# Patient Record
Sex: Female | Born: 1955 | Race: White | Hispanic: No | Marital: Married | Smoking: Never smoker
Health system: Southern US, Community
[De-identification: ages and names within clinical notes are randomized; demographics above are authoritative.]

## PROBLEM LIST (undated history)

## (undated) DIAGNOSIS — I10 Essential (primary) hypertension: Secondary | ICD-10-CM

## (undated) DIAGNOSIS — K769 Liver disease, unspecified: Secondary | ICD-10-CM

## (undated) DIAGNOSIS — N289 Disorder of kidney and ureter, unspecified: Secondary | ICD-10-CM

## (undated) DIAGNOSIS — M797 Fibromyalgia: Secondary | ICD-10-CM

## (undated) DIAGNOSIS — R011 Cardiac murmur, unspecified: Secondary | ICD-10-CM

## (undated) DIAGNOSIS — I4510 Unspecified right bundle-branch block: Secondary | ICD-10-CM

## (undated) DIAGNOSIS — M1990 Unspecified osteoarthritis, unspecified site: Secondary | ICD-10-CM

## (undated) HISTORY — PX: PARTIAL HYSTERECTOMY: SHX80

## (undated) HISTORY — PX: KNEE SURGERY: SHX244

---

## 2009-06-08 ENCOUNTER — Emergency Department (HOSPITAL_COMMUNITY): Admission: EM | Admit: 2009-06-08 | Discharge: 2009-06-08 | Payer: Self-pay | Admitting: Emergency Medicine

## 2009-09-14 ENCOUNTER — Encounter: Admission: RE | Admit: 2009-09-14 | Discharge: 2009-09-14 | Payer: Self-pay | Admitting: Gastroenterology

## 2009-09-27 ENCOUNTER — Ambulatory Visit (HOSPITAL_COMMUNITY): Admission: RE | Admit: 2009-09-27 | Discharge: 2009-09-27 | Payer: Self-pay | Admitting: Gastroenterology

## 2010-03-26 LAB — URINALYSIS, ROUTINE W REFLEX MICROSCOPIC
Ketones, ur: NEGATIVE mg/dL
Protein, ur: NEGATIVE mg/dL
Urobilinogen, UA: 1 mg/dL (ref 0.0–1.0)
pH: 6 (ref 5.0–8.0)

## 2010-03-26 LAB — DIFFERENTIAL
Basophils Absolute: 0.1 10*3/uL (ref 0.0–0.1)
Basophils Relative: 1 % (ref 0–1)
Eosinophils Absolute: 0.2 10*3/uL (ref 0.0–0.7)
Eosinophils Relative: 1 % (ref 0–5)
Lymphocytes Relative: 13 % (ref 12–46)
Lymphs Abs: 1.7 10*3/uL (ref 0.7–4.0)
Monocytes Absolute: 1.1 10*3/uL — ABNORMAL HIGH (ref 0.1–1.0)
Neutro Abs: 9.7 10*3/uL — ABNORMAL HIGH (ref 1.7–7.7)

## 2010-03-26 LAB — COMPREHENSIVE METABOLIC PANEL
BUN: 9 mg/dL (ref 6–23)
Chloride: 103 mEq/L (ref 96–112)
GFR calc non Af Amer: 43 mL/min — ABNORMAL LOW (ref >60)
Glucose, Bld: 83 mg/dL (ref 70–99)
Sodium: 138 mEq/L (ref 135–145)
Total Bilirubin: 1.4 mg/dL — ABNORMAL HIGH (ref 0.3–1.2)

## 2010-03-26 LAB — URINE MICROSCOPIC-ADD ON

## 2010-03-26 LAB — CBC
HCT: 40.7 % (ref 36.0–46.0)
WBC: 12.7 10*3/uL — ABNORMAL HIGH (ref 4.0–10.5)

## 2010-05-23 ENCOUNTER — Other Ambulatory Visit: Payer: Self-pay | Admitting: Family Medicine

## 2010-05-23 DIAGNOSIS — Z1231 Encounter for screening mammogram for malignant neoplasm of breast: Secondary | ICD-10-CM

## 2010-06-07 ENCOUNTER — Ambulatory Visit
Admission: RE | Admit: 2010-06-07 | Discharge: 2010-06-07 | Disposition: A | Discharge status: Home / Self Care | Payer: BC Managed Care – PPO | Source: Ambulatory Visit | Attending: Family Medicine | Admitting: Family Medicine

## 2010-06-07 DIAGNOSIS — Z1231 Encounter for screening mammogram for malignant neoplasm of breast: Secondary | ICD-10-CM

## 2011-11-12 ENCOUNTER — Other Ambulatory Visit: Payer: Self-pay | Admitting: Family Medicine

## 2011-11-12 DIAGNOSIS — Z1231 Encounter for screening mammogram for malignant neoplasm of breast: Secondary | ICD-10-CM

## 2011-11-14 ENCOUNTER — Other Ambulatory Visit: Payer: Self-pay | Admitting: Family Medicine

## 2011-11-14 DIAGNOSIS — Z780 Asymptomatic menopausal state: Secondary | ICD-10-CM

## 2011-12-30 ENCOUNTER — Other Ambulatory Visit: Payer: BC Managed Care – PPO

## 2011-12-30 ENCOUNTER — Ambulatory Visit: Payer: BC Managed Care – PPO

## 2012-08-05 ENCOUNTER — Other Ambulatory Visit: Payer: Self-pay | Admitting: Family Medicine

## 2012-08-05 DIAGNOSIS — Z1231 Encounter for screening mammogram for malignant neoplasm of breast: Secondary | ICD-10-CM

## 2013-06-16 ENCOUNTER — Encounter (HOSPITAL_COMMUNITY): Payer: Self-pay | Admitting: Emergency Medicine

## 2013-06-16 ENCOUNTER — Emergency Department (HOSPITAL_COMMUNITY)
Admission: EM | Admit: 2013-06-16 | Discharge: 2013-06-16 | Disposition: A | Discharge status: Home / Self Care | Payer: BC Managed Care – PPO | Source: Home / Self Care

## 2013-06-16 DIAGNOSIS — R079 Chest pain, unspecified: Secondary | ICD-10-CM

## 2013-06-16 DIAGNOSIS — IMO0001 Reserved for inherently not codable concepts without codable children: Secondary | ICD-10-CM

## 2013-06-16 DIAGNOSIS — M7918 Myalgia, other site: Secondary | ICD-10-CM

## 2013-06-16 HISTORY — DX: Unspecified right bundle-branch block: I45.10

## 2013-06-16 HISTORY — DX: Fibromyalgia: M79.7

## 2013-06-16 HISTORY — DX: Cardiac murmur, unspecified: R01.1

## 2013-06-16 HISTORY — DX: Unspecified osteoarthritis, unspecified site: M19.90

## 2013-06-16 HISTORY — DX: Essential (primary) hypertension: I10

## 2013-06-16 NOTE — Discharge Instructions (Signed)
The cause of your chest pain is not clear but it is likely musculoskeletal in nature and not related to your heart.  Please go home and rest today. Try to stretch your upper body from time to time to see if that helps. Please also start on ibuprofen 400-600mg  every 6 hours as needed for the pain. Please come back to the emergency room if the pain worsens or does not improve as you will need a more extensive workup.

## 2013-06-16 NOTE — ED Provider Notes (Signed)
CSN: [id number]     Arrival date & time 06/16/13  1122 History   None    Chief Complaint  Patient presents with  . Chest Pain  . Shortness of Breath   (Consider location/radiation/quality/duration/timing/severity/associated sxs/prior Treatment) HPI  CP: started around 10am. Sharp in nature (crampy) in upper chest wall. Become more intense and radiated down L arm. Felt SOB due to pain on inspiration. Improving at this time. No change in recent exercise routine. Hurts w/ certain movements. Improves w/ rest. Denies fever, n/v, diaphoresis. Stays very active w/ outside work and 2-3 days in the pool for specific workouts weekly.    Past Medical History  Diagnosis Date  . Hypertension   . Arthritis   . Fibromyalgia   . Right bundle branch block   . Murmur    Past Surgical History  Procedure Laterality Date  . Knee surgery    . Cesarean section    . Partial hysterectomy     Family History  Problem Relation Age of Onset  . Hypertension Mother   . Hypertension Father    History  Substance Use Topics  . Smoking status: Never Smoker   . Smokeless tobacco: Not on file  . Alcohol Use: Yes   OB History   Grav Para Term Preterm Abortions TAB SAB Ect Mult Living                 Review of Systems  Respiratory: Negative for cough and shortness of breath.   Cardiovascular: Positive for chest pain. Negative for palpitations and leg swelling.  All other systems reviewed and are negative.   Allergies  Review of patient's allergies indicates no known allergies.  Home Medications   Prior to Admission medications   Medication Sig Start Date End Date Taking? Authorizing Provider  DULoxetine HCl (CYMBALTA PO) Take by mouth.   Yes Historical Provider, MD  Levothyroxine Sodium (SYNTHROID PO) Take by mouth.   Yes Historical Provider, MD  OVER THE COUNTER MEDICATION htn medicine   Yes Historical Provider, MD   BP 139/97  Pulse 68  Temp(Src) 98.5 F (36.9 C) (Oral)  Resp 20  SpO2  98% Physical Exam  Constitutional: She appears well-developed and well-nourished. No distress.  HENT:  Head: Normocephalic and atraumatic.  Eyes: Conjunctivae are normal. Pupils are equal, round, and reactive to light.  Neck: Normal range of motion. No JVD present.  Cardiovascular: Normal rate, regular rhythm, normal heart sounds and intact distal pulses.  Exam reveals no gallop and no friction rub.   No murmur heard. Pulmonary/Chest: Effort normal and breath sounds normal. No respiratory distress. She has no wheezes. She has no rales. She exhibits no tenderness.  Abdominal: Soft. She exhibits no distension.  Musculoskeletal:  Mildly reproducible on chest wall palpation and w/ thoracic ROM exercises.   Skin: Skin is warm. No rash noted. She is not diaphoretic.  Psychiatric: She has a normal mood and affect. Her behavior is normal. Judgment and thought content normal.    ED Course  Procedures (including critical care time) Labs Review Labs Reviewed - No data to display  Imaging Review No results found.   MDM   1. Chest pain   2. Musculoskeletal pain    57yo F w/ likely non-cardiac MSK type chest pain. RBBB noted on EKG but no signs of ischemia. Pain improving per pt. OK to tgo home, rest, perform upper body stretching. No exercise tonight. Pt understands need for emergent evaluation at ED if pain does not  improve or if worsens. Start NSAIDs for relief for a couple days.  - all questions answered  Shelly Flatten, MD Family Medicine PGY-3 06/16/2013, 12:16 PM     Ozella Rocks, MD 06/16/13 [phone]

## 2013-06-16 NOTE — ED Notes (Signed)
Chest pain, shortness of breath, left arm pain.  Onset this am around 10:00 am, reported as intense initially, no so much now.  Reports this discomfort feels like a muscle spasm now.  pcp office told patient to come to [facility] for evaluation

## 2013-06-16 NOTE — ED Provider Notes (Signed)
Medical screening examination/treatment/procedure(s) were performed by a resident physician or non-physician practitioner and as the supervising physician I was immediately available for consultation/collaboration.  Suleika Donavan, MD    Sevan Mcbroom S Teiana Hajduk, MD 06/16/13 1612 

## 2014-03-31 ENCOUNTER — Other Ambulatory Visit: Payer: Self-pay | Admitting: Family Medicine

## 2014-03-31 DIAGNOSIS — E2839 Other primary ovarian failure: Secondary | ICD-10-CM

## 2014-04-13 ENCOUNTER — Other Ambulatory Visit: Payer: Self-pay | Admitting: Family Medicine

## 2014-04-13 DIAGNOSIS — Z1231 Encounter for screening mammogram for malignant neoplasm of breast: Secondary | ICD-10-CM

## 2014-06-02 ENCOUNTER — Ambulatory Visit
Admission: RE | Admit: 2014-06-02 | Discharge: 2014-06-02 | Disposition: A | Discharge status: Home / Self Care | Payer: BC Managed Care – PPO | Source: Ambulatory Visit | Attending: Family Medicine | Admitting: Family Medicine

## 2014-06-02 DIAGNOSIS — E2839 Other primary ovarian failure: Secondary | ICD-10-CM

## 2014-06-02 DIAGNOSIS — Z1231 Encounter for screening mammogram for malignant neoplasm of breast: Secondary | ICD-10-CM

## 2014-06-07 ENCOUNTER — Other Ambulatory Visit: Payer: Self-pay | Admitting: Family Medicine

## 2014-06-07 DIAGNOSIS — R928 Other abnormal and inconclusive findings on diagnostic imaging of breast: Secondary | ICD-10-CM

## 2014-06-24 ENCOUNTER — Other Ambulatory Visit: Payer: Self-pay | Admitting: Nephrology

## 2014-06-24 DIAGNOSIS — N181 Chronic kidney disease, stage 1: Secondary | ICD-10-CM

## 2014-06-30 ENCOUNTER — Ambulatory Visit
Admission: RE | Admit: 2014-06-30 | Discharge: 2014-06-30 | Disposition: A | Discharge status: Home / Self Care | Payer: BC Managed Care – PPO | Source: Ambulatory Visit | Attending: Nephrology | Admitting: Nephrology

## 2014-06-30 DIAGNOSIS — N181 Chronic kidney disease, stage 1: Secondary | ICD-10-CM

## 2014-07-01 ENCOUNTER — Ambulatory Visit
Admission: RE | Admit: 2014-07-01 | Discharge: 2014-07-01 | Disposition: A | Discharge status: Home / Self Care | Payer: BC Managed Care – PPO | Source: Ambulatory Visit | Attending: Family Medicine | Admitting: Family Medicine

## 2014-07-01 ENCOUNTER — Other Ambulatory Visit: Payer: Self-pay | Admitting: Nephrology

## 2014-07-01 DIAGNOSIS — M797 Fibromyalgia: Secondary | ICD-10-CM

## 2014-07-01 DIAGNOSIS — N183 Chronic kidney disease, stage 3 unspecified: Secondary | ICD-10-CM

## 2014-07-01 DIAGNOSIS — R928 Other abnormal and inconclusive findings on diagnostic imaging of breast: Secondary | ICD-10-CM

## 2014-07-01 DIAGNOSIS — I159 Secondary hypertension, unspecified: Secondary | ICD-10-CM

## 2015-08-26 ENCOUNTER — Ambulatory Visit (HOSPITAL_COMMUNITY)
Admission: EM | Admit: 2015-08-26 | Discharge: 2015-08-26 | Disposition: A | Discharge status: Home / Self Care | Payer: BC Managed Care – PPO | Attending: Radiology | Admitting: Radiology

## 2015-08-26 ENCOUNTER — Encounter (HOSPITAL_COMMUNITY): Payer: Self-pay | Admitting: Emergency Medicine

## 2015-08-26 ENCOUNTER — Ambulatory Visit (INDEPENDENT_AMBULATORY_CARE_PROVIDER_SITE_OTHER): Payer: BC Managed Care – PPO

## 2015-08-26 DIAGNOSIS — W19XXXA Unspecified fall, initial encounter: Secondary | ICD-10-CM

## 2015-08-26 DIAGNOSIS — M79601 Pain in right arm: Secondary | ICD-10-CM | POA: Diagnosis not present

## 2015-08-26 DIAGNOSIS — M79602 Pain in left arm: Secondary | ICD-10-CM

## 2015-08-26 MED ORDER — TRAMADOL HCL 50 MG PO TABS: 50.0000 mg | ORAL_TABLET | Freq: Four times a day (QID) | ORAL | 0 refills | Status: AC | PRN

## 2015-08-26 NOTE — ED Triage Notes (Signed)
Pt reports she fell this am onto concrete... Landed on her left forearm  Sx today include swelling and pain  Denies head inj/LOC  A&O x4... NAD

## 2015-08-26 NOTE — ED Notes (Signed)
[facility] has been paged and notified.

## 2015-08-26 NOTE — Discharge Instructions (Addendum)
Continue ice, Ibuprofen as directed on the back on the box. Patient educated provided on compartment syndrome. Any changes in circulation 9 warmth, pulses, cap refill) and patient should go to the emergency department

## 2015-08-26 NOTE — Progress Notes (Signed)
Orthopedic Tech Progress Note Patient Details:  Summer Sexton [birth year] [id number]  Ortho Devices Type of Ortho Device: Ace wrap, Arm sling, Post (long arm) splint Ortho Device/Splint Location: LUE Ortho Device/Splint Interventions: Ordered, Application   Jennye MoccasinHughes, Carnie Bruemmer Craig 08/26/2015, 3:17 PM

## 2015-08-26 NOTE — ED Provider Notes (Signed)
CSN: [id number]     Arrival date & time 08/26/15  1211 History   None    Chief Complaint  Patient presents with  . Arm Injury   (Consider location/radiation/quality/duration/timing/severity/associated sxs/prior Treatment) Patient presents with injuries related to fall from standing landing on concrete this morning going down a step. Condition is acute in nature. Condition is made better by nothing. Condition is made worse by movement. Patient denies any relief from ice prior to there arrival at this facility. Patient denies any loss of consciousness or additional injuries. Patient arm is warm, cap refil is < 2 good radial pulses. Spoke to Dr Merlyn LotKuzma who recommends patient be placed in a posterior splint and should call his office on Monday.        Past Medical History:  Diagnosis Date  . Arthritis   . Fibromyalgia   . Hypertension   . Murmur   . Right bundle branch block    Past Surgical History:  Procedure Laterality Date  . CESAREAN SECTION    . KNEE SURGERY    . PARTIAL HYSTERECTOMY     Family History  Problem Relation Age of Onset  . Hypertension Mother   . Hypertension Father    Social History  Substance Use Topics  . Smoking status: Never Smoker  . Smokeless tobacco: Never Used  . Alcohol use Yes   OB History    No data available     Review of Systems  Constitutional: Negative.   Musculoskeletal:       Arm pain with movement to left arm and swelling to forearm and hand. Pain is described as diffuse to left extremity. Patient is vague in the description    Allergies  Review of patient's allergies indicates no known allergies.  Home Medications   Prior to Admission medications   Medication Sig Start Date End Date Taking? Authorizing Provider  DULoxetine HCl (CYMBALTA PO) Take by mouth.    Historical Provider, MD  Levothyroxine Sodium (SYNTHROID PO) Take by mouth.    Historical Provider, MD  OVER THE COUNTER MEDICATION htn medicine    Historical Provider,  MD  traMADol (ULTRAM) 50 MG tablet Take 1 tablet (50 mg total) by mouth every 6 (six) hours as needed. 08/26/15   Alene MiresJennifer C Omohundro, NP   Meds Ordered and Administered this Visit  Medications - No data to display  BP 156/78 (BP Location: Left Arm)   Temp 97.6 F (36.4 C) (Oral)   Resp 18   SpO2 97%  No data found.   Physical Exam  Constitutional: She is oriented to person, place, and time. She appears well-developed and well-nourished.  Musculoskeletal:  No tenderness noted to affected extremity.Patient has full range of motion  Wrist and finger however motion is slow and deliberate. tenderness noted to lateral elbow. Minimal swelling to lower extremity.   Neurological: She is alert and oriented to person, place, and time.  Skin: Skin is warm and dry. Capillary refill takes less than 2 seconds.    Urgent Care Course   Clinical Course    Procedures (including critical care time)  Labs Review Labs Reviewed - No data to display  Imaging Review No results found.   Visual Acuity Review  Right Eye Distance:   Left Eye Distance:   Bilateral Distance:    Right Eye Near:   Left Eye Near:    Bilateral Near:         MDM   1. Fall, initial encounter  Alene MiresJennifer C Omohundro, NP 08/26/15 1511    Alene MiresJennifer C Omohundro, NP 08/26/15 1838    Alene MiresJennifer C Omohundro, NP 09/23/15 [phone]

## 2017-03-23 ENCOUNTER — Other Ambulatory Visit: Payer: Self-pay

## 2017-03-23 ENCOUNTER — Ambulatory Visit (HOSPITAL_COMMUNITY)
Admission: EM | Admit: 2017-03-23 | Discharge: 2017-03-23 | Disposition: A | Discharge status: Home / Self Care | Payer: BC Managed Care – PPO

## 2017-03-23 ENCOUNTER — Encounter (HOSPITAL_COMMUNITY): Payer: Self-pay | Admitting: Emergency Medicine

## 2017-03-23 DIAGNOSIS — J019 Acute sinusitis, unspecified: Secondary | ICD-10-CM

## 2017-03-23 DIAGNOSIS — R059 Cough, unspecified: Secondary | ICD-10-CM

## 2017-03-23 DIAGNOSIS — R0789 Other chest pain: Secondary | ICD-10-CM

## 2017-03-23 DIAGNOSIS — R05 Cough: Secondary | ICD-10-CM

## 2017-03-23 DIAGNOSIS — H6690 Otitis media, unspecified, unspecified ear: Secondary | ICD-10-CM

## 2017-03-23 HISTORY — DX: Disorder of kidney and ureter, unspecified: N28.9

## 2017-03-23 HISTORY — DX: Liver disease, unspecified: K76.9

## 2017-03-23 MED ORDER — AMOXICILLIN-POT CLAVULANATE 875-125 MG PO TABS: 1.0000 | ORAL_TABLET | Freq: Two times a day (BID) | ORAL | 0 refills | Status: DC

## 2017-03-23 MED ORDER — BENZONATATE 100 MG PO CAPS
100.0000 mg | ORAL_CAPSULE | Freq: Three times a day (TID) | ORAL | 0 refills | Status: AC | PRN
Start: 2017-03-23 — End: ?

## 2017-03-23 NOTE — Discharge Instructions (Signed)
You may take 500mg Tylenol with ibuprofen 400-600mg every 6 hours for pain and inflammation. °

## 2017-03-23 NOTE — ED Provider Notes (Signed)
  MRN: [id number] DOB: 1955-09-01  Subjective:   Summer Sexton is a 62 y.o. female presenting for 5 day history bilateral eye redness, sinus congestion, productive cough, bilateral ear pain, had blood drain from left ear just today. Has right sided chest pain with her cough, mild shob. Denies fever, n/v, abdominal pain, tinnitus. She did try nasal decongestant, Mucinex. Denies smoking cigarettes.   No current facility-administered medications for this encounter.    Current Outpatient Medications  Medication Sig Dispense Refill  . dextromethorphan-guaiFENesin (MUCINEX DM) 30-600 MG 12hr tablet Take 1 tablet by mouth 2 (two) times daily.    Marland Kitchen. ibuprofen (ADVIL,MOTRIN) 200 MG tablet Take 200 mg by mouth every 6 (six) hours as needed.    Marland Kitchen. amoxicillin-clavulanate (AUGMENTIN) 875-125 MG tablet Take 1 tablet by mouth every 12 (twelve) hours. 20 tablet 0  . benzonatate (TESSALON) 100 MG capsule Take 1-2 capsules (100-200 mg total) by mouth 3 (three) times daily as needed for cough. 60 capsule 0  . DULoxetine HCl (CYMBALTA PO) Take by mouth.    . Levothyroxine Sodium (SYNTHROID PO) Take by mouth.    Marland Kitchen. OVER THE COUNTER MEDICATION htn medicine    . traMADol (ULTRAM) 50 MG tablet Take 1 tablet (50 mg total) by mouth every 6 (six) hours as needed. 15 tablet 0    Summer Sexton has No Known Allergies.  Summer Sexton  has a past medical history of Arthritis, Fibromyalgia, Hypertension, Kidney disease, Liver disease, Murmur, and Right bundle branch block. Also  has a past surgical history that includes Knee surgery; Cesarean section; and Partial hysterectomy.  Objective:   Vitals: BP (!) 142/82 (BP Location: Right Arm)   Pulse 64   Temp (!) 97.4 F (36.3 C) (Oral)   Resp 18   SpO2 97%   Physical Exam  Constitutional: She is oriented to person, place, and time. She appears well-developed and well-nourished.  HENT:  Right Ear: Tympanic membrane normal.  Left Ear: There is drainage. Tympanic membrane is erythematous.  Nose:  Right sinus exhibits maxillary sinus tenderness. Left sinus exhibits maxillary sinus tenderness.  Mouth/Throat: Oropharynx is clear and moist.  Eyes: Right eye exhibits no discharge. Left eye exhibits no discharge.  Conjunctiva injected bilaterally.  Cardiovascular: Normal rate, regular rhythm and intact distal pulses. Exam reveals no gallop and no friction rub.  No murmur heard. Pulmonary/Chest: No respiratory distress. She has no wheezes. She has no rales.  Neurological: She is alert and oriented to person, place, and time.  Skin: Skin is warm and dry.   Assessment and Plan :   Acute sinusitis, recurrence not specified, unspecified location  Acute otitis media, unspecified otitis media type  Cough  Atypical chest pain  Will start Augmentin, schedule APAP with ibuprofen. Offered albuterol inhaler, patient declined. Use Tessalon for cough. Return-to-clinic precautions discussed, patient verbalized understanding.    Wallis BambergMani, Derold Dorsch, [place]PA-C 03/23/17 [phone]

## 2017-03-23 NOTE — ED Triage Notes (Signed)
C/o bilateral eye redness and left ear "bleeding" onset this AM

## 2017-06-10 ENCOUNTER — Other Ambulatory Visit: Payer: Self-pay | Admitting: Family Medicine

## 2017-06-10 DIAGNOSIS — N63 Unspecified lump in unspecified breast: Secondary | ICD-10-CM

## 2017-06-18 ENCOUNTER — Ambulatory Visit
Admission: RE | Admit: 2017-06-18 | Discharge: 2017-06-18 | Disposition: A | Discharge status: Home / Self Care | Payer: BC Managed Care – PPO | Source: Ambulatory Visit | Attending: Family Medicine | Admitting: Family Medicine

## 2017-06-18 DIAGNOSIS — N63 Unspecified lump in unspecified breast: Secondary | ICD-10-CM

## 2018-09-16 ENCOUNTER — Other Ambulatory Visit: Payer: Self-pay

## 2018-09-16 DIAGNOSIS — Z20822 Contact with and (suspected) exposure to covid-19: Secondary | ICD-10-CM

## 2018-09-18 LAB — NOVEL CORONAVIRUS, NAA: SARS-CoV-2, NAA: NOT DETECTED

## 2018-12-22 ENCOUNTER — Other Ambulatory Visit: Payer: Self-pay

## 2018-12-22 ENCOUNTER — Other Ambulatory Visit: Payer: Self-pay | Admitting: Family Medicine

## 2018-12-22 ENCOUNTER — Ambulatory Visit
Admission: RE | Admit: 2018-12-22 | Discharge: 2018-12-22 | Disposition: A | Discharge status: Home / Self Care | Payer: BC Managed Care – PPO | Source: Ambulatory Visit | Attending: Family Medicine | Admitting: Family Medicine

## 2018-12-22 DIAGNOSIS — Z1231 Encounter for screening mammogram for malignant neoplasm of breast: Secondary | ICD-10-CM

## 2019-11-23 ENCOUNTER — Ambulatory Visit
Admission: RE | Admit: 2019-11-23 | Discharge: 2019-11-23 | Disposition: A | Discharge status: Home / Self Care | Payer: BC Managed Care – PPO | Source: Ambulatory Visit | Attending: Family Medicine | Admitting: Family Medicine

## 2019-11-23 ENCOUNTER — Other Ambulatory Visit: Payer: Self-pay

## 2019-11-23 ENCOUNTER — Other Ambulatory Visit: Payer: Self-pay | Admitting: Family Medicine

## 2019-11-23 DIAGNOSIS — R52 Pain, unspecified: Secondary | ICD-10-CM

## 2020-02-10 ENCOUNTER — Other Ambulatory Visit: Payer: Self-pay | Admitting: Family Medicine

## 2020-02-10 DIAGNOSIS — Z1231 Encounter for screening mammogram for malignant neoplasm of breast: Secondary | ICD-10-CM

## 2020-03-01 ENCOUNTER — Ambulatory Visit: Payer: 59 | Attending: Family Medicine | Admitting: Audiologist

## 2020-03-01 ENCOUNTER — Other Ambulatory Visit: Payer: Self-pay

## 2020-03-01 DIAGNOSIS — H903 Sensorineural hearing loss, bilateral: Secondary | ICD-10-CM | POA: Insufficient documentation

## 2020-03-01 NOTE — Procedures (Signed)
  Outpatient Audiology and [facility] [address] [place], [place]  [address] [phone]  AUDIOLOGICAL  EVALUATION  NAME: Summer Sexton     DOB:   1955-09-04      MRN: [id number]                                                                                     DATE: 03/01/2020     REFERENT: Lewis Moccasin, MD STATUS: Outpatient DIAGNOSIS: Sensorineural hearing loss (SNHL) of both ears    History: Summer Sexton was seen for an audiological evaluation.  Summer Sexton is receiving a hearing evaluation due to concerns for difficulty hearing when other people are talking. Summer Sexton has difficulty hearing in background noise.  This difficulty began gradually. No pain or pressure reported in either ear. No tinnitus present in either ear. Summer Sexton has a family history of hearing loss. She has no significant history of noise exposure. 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  Medical history negative for a risk factor for hearing loss. No other relevant case history reported.   Evaluation:   Otoscopy showed a clear view of the tympanic membranes, bilaterally  Tympanometry results were consistent with normal middle ear function bilaterally    Audiometric testing was completed using conventional audiometry with insert transducer. Speech Recognition Thresholds were consistent with pure tone averages. Word Recognition was excellent at conversation elevated level. Pure tone thresholds show normal sloping to moderate sensorineural hearing loss in both ears. 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   Results:  The test results were reviewed with Summer Sexton. She was counseled on the nature and degree of her hearing loss. She was provided with several copies of her audiogram that illustrate her degree of hearing loss in both ears. Her hearing loss is in the high frequencies preventing Summer Sexton from hearing high frequency consonants such as /s/ /sh/ /f/ /t/ and /th/.  These sounds help differentiate the words she hears. Without these sounds, speech is muffled and unclear unless someone is face to face within 5 feet without a mask. All of these sounds are visible on the lips. Summer Sexton had good ability to understand speech in noise on the Quick speech in noise test.  Summer Sexton is not a hearing aid candidate at this time, but monitoring of hearing is recommended.   Recommendations: 1.   Audiologic testing is needed every other year to monitor hearing los progression, or sooner is additional hearing concerns arise.   Summer Sexton  Audiologist, Au.D., CCC-A 03/01/2020  8:39 AM  Cc: Lewis Moccasin, MD

## 2020-07-04 ENCOUNTER — Emergency Department (HOSPITAL_COMMUNITY): Payer: 59

## 2020-07-04 DIAGNOSIS — Z4659 Encounter for fitting and adjustment of other gastrointestinal appliance and device: Secondary | ICD-10-CM

## 2020-07-04 DIAGNOSIS — S01511A Laceration without foreign body of lip, initial encounter: Secondary | ICD-10-CM | POA: Diagnosis present

## 2020-07-04 DIAGNOSIS — Z66 Do not resuscitate: Secondary | ICD-10-CM | POA: Diagnosis not present

## 2020-07-04 DIAGNOSIS — I214 Non-ST elevation (NSTEMI) myocardial infarction: Secondary | ICD-10-CM | POA: Diagnosis present

## 2020-07-04 DIAGNOSIS — I462 Cardiac arrest due to underlying cardiac condition: Secondary | ICD-10-CM | POA: Diagnosis present

## 2020-07-04 DIAGNOSIS — J9601 Acute respiratory failure with hypoxia: Secondary | ICD-10-CM | POA: Diagnosis present

## 2020-07-04 DIAGNOSIS — Z79899 Other long term (current) drug therapy: Secondary | ICD-10-CM

## 2020-07-04 DIAGNOSIS — I10 Essential (primary) hypertension: Secondary | ICD-10-CM | POA: Diagnosis not present

## 2020-07-04 DIAGNOSIS — R7989 Other specified abnormal findings of blood chemistry: Secondary | ICD-10-CM

## 2020-07-04 DIAGNOSIS — E7220 Disorder of urea cycle metabolism, unspecified: Secondary | ICD-10-CM

## 2020-07-04 DIAGNOSIS — Z79890 Hormone replacement therapy (postmenopausal): Secondary | ICD-10-CM

## 2020-07-04 DIAGNOSIS — I4901 Ventricular fibrillation: Secondary | ICD-10-CM | POA: Diagnosis present

## 2020-07-04 DIAGNOSIS — Z452 Encounter for adjustment and management of vascular access device: Secondary | ICD-10-CM | POA: Diagnosis not present

## 2020-07-04 DIAGNOSIS — I129 Hypertensive chronic kidney disease with stage 1 through stage 4 chronic kidney disease, or unspecified chronic kidney disease: Secondary | ICD-10-CM | POA: Diagnosis present

## 2020-07-04 DIAGNOSIS — K760 Fatty (change of) liver, not elsewhere classified: Secondary | ICD-10-CM | POA: Diagnosis present

## 2020-07-04 DIAGNOSIS — Z20822 Contact with and (suspected) exposure to covid-19: Secondary | ICD-10-CM | POA: Diagnosis present

## 2020-07-04 DIAGNOSIS — I4510 Unspecified right bundle-branch block: Secondary | ICD-10-CM | POA: Diagnosis present

## 2020-07-04 DIAGNOSIS — N170 Acute kidney failure with tubular necrosis: Secondary | ICD-10-CM | POA: Diagnosis present

## 2020-07-04 DIAGNOSIS — Z781 Physical restraint status: Secondary | ICD-10-CM

## 2020-07-04 DIAGNOSIS — J690 Pneumonitis due to inhalation of food and vomit: Secondary | ICD-10-CM | POA: Diagnosis present

## 2020-07-04 DIAGNOSIS — K769 Liver disease, unspecified: Secondary | ICD-10-CM | POA: Diagnosis present

## 2020-07-04 DIAGNOSIS — I469 Cardiac arrest, cause unspecified: Secondary | ICD-10-CM | POA: Diagnosis present

## 2020-07-04 DIAGNOSIS — E878 Other disorders of electrolyte and fluid balance, not elsewhere classified: Secondary | ICD-10-CM

## 2020-07-04 DIAGNOSIS — E872 Acidosis, unspecified: Secondary | ICD-10-CM

## 2020-07-04 DIAGNOSIS — Z90711 Acquired absence of uterus with remaining cervical stump: Secondary | ICD-10-CM

## 2020-07-04 DIAGNOSIS — F32A Depression, unspecified: Secondary | ICD-10-CM | POA: Diagnosis present

## 2020-07-04 DIAGNOSIS — R570 Cardiogenic shock: Secondary | ICD-10-CM | POA: Diagnosis present

## 2020-07-04 DIAGNOSIS — E7800 Pure hypercholesterolemia, unspecified: Secondary | ICD-10-CM | POA: Diagnosis present

## 2020-07-04 DIAGNOSIS — G9341 Metabolic encephalopathy: Secondary | ICD-10-CM

## 2020-07-04 DIAGNOSIS — K559 Vascular disorder of intestine, unspecified: Secondary | ICD-10-CM | POA: Diagnosis present

## 2020-07-04 DIAGNOSIS — Z8659 Personal history of other mental and behavioral disorders: Secondary | ICD-10-CM

## 2020-07-04 DIAGNOSIS — G928 Other toxic encephalopathy: Secondary | ICD-10-CM | POA: Diagnosis present

## 2020-07-04 DIAGNOSIS — K7200 Acute and subacute hepatic failure without coma: Secondary | ICD-10-CM | POA: Diagnosis present

## 2020-07-04 DIAGNOSIS — W19XXXA Unspecified fall, initial encounter: Secondary | ICD-10-CM | POA: Diagnosis present

## 2020-07-04 DIAGNOSIS — M1990 Unspecified osteoarthritis, unspecified site: Secondary | ICD-10-CM | POA: Diagnosis present

## 2020-07-04 DIAGNOSIS — Y92009 Unspecified place in unspecified non-institutional (private) residence as the place of occurrence of the external cause: Secondary | ICD-10-CM | POA: Diagnosis not present

## 2020-07-04 DIAGNOSIS — M797 Fibromyalgia: Secondary | ICD-10-CM | POA: Diagnosis present

## 2020-07-04 DIAGNOSIS — E876 Hypokalemia: Secondary | ICD-10-CM | POA: Diagnosis not present

## 2020-07-04 DIAGNOSIS — E871 Hypo-osmolality and hyponatremia: Secondary | ICD-10-CM | POA: Diagnosis present

## 2020-07-04 DIAGNOSIS — Z515 Encounter for palliative care: Secondary | ICD-10-CM

## 2020-07-04 DIAGNOSIS — E039 Hypothyroidism, unspecified: Secondary | ICD-10-CM | POA: Diagnosis present

## 2020-07-04 DIAGNOSIS — R7401 Elevation of levels of liver transaminase levels: Secondary | ICD-10-CM

## 2020-07-04 DIAGNOSIS — D72829 Elevated white blood cell count, unspecified: Secondary | ICD-10-CM

## 2020-07-04 DIAGNOSIS — J9600 Acute respiratory failure, unspecified whether with hypoxia or hypercapnia: Secondary | ICD-10-CM

## 2020-07-04 DIAGNOSIS — N182 Chronic kidney disease, stage 2 (mild): Secondary | ICD-10-CM | POA: Diagnosis present

## 2020-07-04 DIAGNOSIS — R197 Diarrhea, unspecified: Secondary | ICD-10-CM

## 2020-07-04 DIAGNOSIS — Z862 Personal history of diseases of the blood and blood-forming organs and certain disorders involving the immune mechanism: Secondary | ICD-10-CM

## 2020-07-04 DIAGNOSIS — Z0189 Encounter for other specified special examinations: Secondary | ICD-10-CM

## 2020-07-04 DIAGNOSIS — K567 Ileus, unspecified: Secondary | ICD-10-CM | POA: Diagnosis present

## 2020-07-04 DIAGNOSIS — R9431 Abnormal electrocardiogram [ECG] [EKG]: Secondary | ICD-10-CM | POA: Diagnosis present

## 2020-07-04 DIAGNOSIS — Z8249 Family history of ischemic heart disease and other diseases of the circulatory system: Secondary | ICD-10-CM

## 2020-07-04 DIAGNOSIS — Z8639 Personal history of other endocrine, nutritional and metabolic disease: Secondary | ICD-10-CM

## 2020-07-04 DIAGNOSIS — T17800A Unspecified foreign body in other parts of respiratory tract causing asphyxiation, initial encounter: Secondary | ICD-10-CM

## 2020-07-04 DIAGNOSIS — R739 Hyperglycemia, unspecified: Secondary | ICD-10-CM | POA: Diagnosis present

## 2020-07-04 DIAGNOSIS — Z90710 Acquired absence of both cervix and uterus: Secondary | ICD-10-CM

## 2020-07-04 DIAGNOSIS — N179 Acute kidney failure, unspecified: Secondary | ICD-10-CM | POA: Diagnosis not present

## 2020-07-04 LAB — URINALYSIS, ROUTINE W REFLEX MICROSCOPIC
Bilirubin Urine: NEGATIVE
Glucose, UA: >=500 mg/dL — AB
Ketones, ur: 5 mg/dL — AB
Leukocytes,Ua: NEGATIVE
Nitrite: NEGATIVE
Protein, ur: >=300 mg/dL — AB
RBC / HPF: >50 RBC/hpf — ABNORMAL HIGH (ref 0–5)
Specific Gravity, Urine: 1.01 (ref 1.005–1.030)
pH: 6 (ref 5.0–8.0)

## 2020-07-04 LAB — RESP PANEL BY RT-PCR (FLU A&B, COVID) ARPGX2
Influenza A by PCR: NEGATIVE
Influenza B by PCR: NEGATIVE
SARS Coronavirus 2 by RT PCR: NEGATIVE

## 2020-07-04 LAB — CBC WITH DIFFERENTIAL/PLATELET
Abs Immature Granulocytes: 0.59 10*3/uL — ABNORMAL HIGH (ref 0.00–0.07)
Basophils Absolute: 0.1 10*3/uL (ref 0.0–0.1)
Basophils Relative: 1 %
Eosinophils Absolute: 0.2 10*3/uL (ref 0.0–0.5)
Eosinophils Relative: 1 %
HCT: 38.9 % (ref 36.0–46.0)
Hemoglobin: 12.3 g/dL (ref 12.0–15.0)
Immature Granulocytes: 3 %
Lymphocytes Relative: 40 %
Lymphs Abs: 7.7 10*3/uL — ABNORMAL HIGH (ref 0.7–4.0)
MCH: 31.2 pg (ref 26.0–34.0)
MCHC: 31.6 g/dL (ref 30.0–36.0)
MCV: 98.7 fL (ref 80.0–100.0)
Monocytes Absolute: 0.6 10*3/uL (ref 0.1–1.0)
Monocytes Relative: 3 %
Neutro Abs: 10.2 10*3/uL — ABNORMAL HIGH (ref 1.7–7.7)
Neutrophils Relative %: 52 %
Platelets: 210 10*3/uL (ref 150–400)
RBC: 3.94 MIL/uL (ref 3.87–5.11)
RDW: 12.9 % (ref 11.5–15.5)
WBC: 19.3 10*3/uL — ABNORMAL HIGH (ref 4.0–10.5)
nRBC: 0.4 % — ABNORMAL HIGH (ref 0.0–0.2)

## 2020-07-04 LAB — I-STAT VENOUS BLOOD GAS, ED
Acid-base deficit: 14 mmol/L — ABNORMAL HIGH (ref 0.0–2.0)
Bicarbonate: 12.8 mmol/L — ABNORMAL LOW (ref 20.0–28.0)
Calcium, Ion: 1.36 mmol/L (ref 1.15–1.40)
HCT: 36 % (ref 36.0–46.0)
Hemoglobin: 12.2 g/dL (ref 12.0–15.0)
O2 Saturation: 88 %
Potassium: 3.4 mmol/L — ABNORMAL LOW (ref 3.5–5.1)
Sodium: 139 mmol/L (ref 135–145)
TCO2: 14 mmol/L — ABNORMAL LOW (ref 22–32)
pCO2, Ven: 33.9 mmHg — ABNORMAL LOW (ref 44.0–60.0)
pH, Ven: 7.184 — CL (ref 7.250–7.430)
pO2, Ven: 68 mmHg — ABNORMAL HIGH (ref 32.0–45.0)

## 2020-07-04 LAB — I-STAT CHEM 8, ED
BUN: 29 mg/dL — ABNORMAL HIGH (ref 8–23)
Calcium, Ion: 1.35 mmol/L (ref 1.15–1.40)
Chloride: 107 mmol/L (ref 98–111)
Creatinine, Ser: 1.8 mg/dL — ABNORMAL HIGH (ref 0.44–1.00)
Glucose, Bld: 304 mg/dL — ABNORMAL HIGH (ref 70–99)
HCT: 37 % (ref 36.0–46.0)
Hemoglobin: 12.6 g/dL (ref 12.0–15.0)
Potassium: 3.3 mmol/L — ABNORMAL LOW (ref 3.5–5.1)
Sodium: 139 mmol/L (ref 135–145)
TCO2: 15 mmol/L — ABNORMAL LOW (ref 22–32)

## 2020-07-04 LAB — COMPREHENSIVE METABOLIC PANEL
ALT: 377 U/L — ABNORMAL HIGH (ref 0–44)
AST: 351 U/L — ABNORMAL HIGH (ref 15–41)
Albumin: 3.1 g/dL — ABNORMAL LOW (ref 3.5–5.0)
Alkaline Phosphatase: 68 U/L (ref 38–126)
Anion gap: 22 — ABNORMAL HIGH (ref 5–15)
BUN: 25 mg/dL — ABNORMAL HIGH (ref 8–23)
CO2: 13 mmol/L — ABNORMAL LOW (ref 22–32)
Calcium: 11 mg/dL — ABNORMAL HIGH (ref 8.9–10.3)
Chloride: 105 mmol/L (ref 98–111)
Creatinine, Ser: 1.97 mg/dL — ABNORMAL HIGH (ref 0.44–1.00)
GFR, Estimated: 28 mL/min — ABNORMAL LOW (ref >60)
Glucose, Bld: 315 mg/dL — ABNORMAL HIGH (ref 70–99)
Potassium: 3.8 mmol/L (ref 3.5–5.1)
Sodium: 140 mmol/L (ref 135–145)
Total Bilirubin: 0.6 mg/dL (ref 0.3–1.2)
Total Protein: 5.3 g/dL — ABNORMAL LOW (ref 6.5–8.1)

## 2020-07-04 MED ORDER — POLYETHYLENE GLYCOL 3350 17 G PO PACK: 17.0000 g | PACK | Freq: Every day | ORAL | Status: DC

## 2020-07-04 MED ORDER — FENTANYL CITRATE (PF) 100 MCG/2ML IJ SOLN
50.0000 ug | INTRAMUSCULAR | Status: DC | PRN
  Administered 2020-07-04: 50 ug via INTRAVENOUS

## 2020-07-04 MED ORDER — FENTANYL CITRATE (PF) 100 MCG/2ML IJ SOLN
50.0000 ug | INTRAMUSCULAR | Status: DC | PRN
  Filled 2020-07-04: qty 2

## 2020-07-04 MED ORDER — EPINEPHRINE HCL 5 MG/250ML IV SOLN IN NS
0.5000 ug/min | INTRAVENOUS | Status: DC
Start: 2020-07-04 — End: 2020-07-06
  Administered 2020-07-04 – 2020-07-05 (×2): 2 ug/min via INTRAVENOUS
  Administered 2020-07-05: 18 ug/min via INTRAVENOUS
  Administered 2020-07-05: 13 ug/min via INTRAVENOUS
  Administered 2020-07-06: 18 ug/min via INTRAVENOUS
  Administered 2020-07-06: 12 ug/min via INTRAVENOUS
  Filled 2020-07-04 (×5): qty 250

## 2020-07-04 MED ORDER — DOCUSATE SODIUM 100 MG PO CAPS: 100.0000 mg | ORAL_CAPSULE | Freq: Two times a day (BID) | ORAL | Status: DC | PRN

## 2020-07-04 MED ORDER — HEPARIN SODIUM (PORCINE) 5000 UNIT/ML IJ SOLN
5000.0000 [IU] | Freq: Three times a day (TID) | INTRAMUSCULAR | Status: DC
  Administered 2020-07-05: 5000 [IU] via SUBCUTANEOUS
  Filled 2020-07-04: qty 1

## 2020-07-04 MED ORDER — FENTANYL CITRATE (PF) 100 MCG/2ML IJ SOLN
50.0000 ug | INTRAMUSCULAR | Status: DC | PRN
  Administered 2020-07-04 – 2020-07-05 (×2): 100 ug via INTRAVENOUS
  Filled 2020-07-04: qty 4
  Filled 2020-07-04: qty 2

## 2020-07-04 MED ORDER — DOCUSATE SODIUM 50 MG/5ML PO LIQD: 100.0000 mg | Freq: Two times a day (BID) | ORAL | Status: DC | PRN

## 2020-07-04 MED ORDER — LACTATED RINGERS IV SOLN
INTRAVENOUS | Status: DC
  Administered 2020-07-05: 10 mL/h via INTRAVENOUS

## 2020-07-04 MED ORDER — PANTOPRAZOLE SODIUM 40 MG PO PACK
40.0000 mg | PACK | Freq: Every day | ORAL | Status: DC
  Administered 2020-07-05: 40 mg
  Filled 2020-07-04: qty 20

## 2020-07-04 MED ORDER — FENTANYL CITRATE (PF) 100 MCG/2ML IJ SOLN
INTRAMUSCULAR | Status: AC
  Filled 2020-07-04: qty 2

## 2020-07-04 MED ORDER — POLYETHYLENE GLYCOL 3350 17 G PO PACK: 17.0000 g | PACK | Freq: Every day | ORAL | Status: DC | PRN

## 2020-07-04 MED ORDER — DOCUSATE SODIUM 50 MG/5ML PO LIQD: 100.0000 mg | Freq: Two times a day (BID) | ORAL | Status: DC

## 2020-07-04 NOTE — ED Triage Notes (Signed)
Pt presents to ED BIB [facility] from home. Per EMS husband heard pt collapse. She was down for 3-70m and husband began CPR. Pt in V. Fib w/ EMS. Fire shocked twice. EMS began CPR @ 2053 got ROSC at 2125. Per EMS opt had purposeful movement after ROSC. Pt in NSR w/ HR if 80 on arrival EMS given - 9 epi 450 amio 2g mag 2 bicarb 2.5 versed 100 lido 50 fent epi drip
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
## 2020-07-04 NOTE — Progress Notes (Signed)
Pt transported from ED Trauma A to CT and back with no complications.

## 2020-07-04 NOTE — Progress Notes (Signed)
No ABG at this time per MD Madilyn Hook. VBG will be obtained and spo2 is 100%.

## 2020-07-05 ENCOUNTER — Inpatient Hospital Stay (HOSPITAL_COMMUNITY): Payer: 59

## 2020-07-05 DIAGNOSIS — R7401 Elevation of levels of liver transaminase levels: Secondary | ICD-10-CM

## 2020-07-05 DIAGNOSIS — E878 Other disorders of electrolyte and fluid balance, not elsewhere classified: Secondary | ICD-10-CM

## 2020-07-05 DIAGNOSIS — J690 Pneumonitis due to inhalation of food and vomit: Secondary | ICD-10-CM

## 2020-07-05 DIAGNOSIS — G9341 Metabolic encephalopathy: Secondary | ICD-10-CM

## 2020-07-05 DIAGNOSIS — E876 Hypokalemia: Secondary | ICD-10-CM | POA: Diagnosis not present

## 2020-07-05 DIAGNOSIS — I10 Essential (primary) hypertension: Secondary | ICD-10-CM

## 2020-07-05 DIAGNOSIS — Z452 Encounter for adjustment and management of vascular access device: Secondary | ICD-10-CM

## 2020-07-05 DIAGNOSIS — R197 Diarrhea, unspecified: Secondary | ICD-10-CM

## 2020-07-05 DIAGNOSIS — R7989 Other specified abnormal findings of blood chemistry: Secondary | ICD-10-CM

## 2020-07-05 DIAGNOSIS — I469 Cardiac arrest, cause unspecified: Secondary | ICD-10-CM

## 2020-07-05 DIAGNOSIS — N179 Acute kidney failure, unspecified: Secondary | ICD-10-CM

## 2020-07-05 DIAGNOSIS — R570 Cardiogenic shock: Secondary | ICD-10-CM | POA: Diagnosis present

## 2020-07-05 DIAGNOSIS — E872 Acidosis, unspecified: Secondary | ICD-10-CM

## 2020-07-05 DIAGNOSIS — E7220 Disorder of urea cycle metabolism, unspecified: Secondary | ICD-10-CM

## 2020-07-05 DIAGNOSIS — Z8659 Personal history of other mental and behavioral disorders: Secondary | ICD-10-CM

## 2020-07-05 DIAGNOSIS — J9601 Acute respiratory failure with hypoxia: Secondary | ICD-10-CM | POA: Diagnosis present

## 2020-07-05 DIAGNOSIS — R739 Hyperglycemia, unspecified: Secondary | ICD-10-CM

## 2020-07-05 DIAGNOSIS — I214 Non-ST elevation (NSTEMI) myocardial infarction: Secondary | ICD-10-CM | POA: Diagnosis not present

## 2020-07-05 DIAGNOSIS — N182 Chronic kidney disease, stage 2 (mild): Secondary | ICD-10-CM | POA: Diagnosis present

## 2020-07-05 DIAGNOSIS — E039 Hypothyroidism, unspecified: Secondary | ICD-10-CM

## 2020-07-05 DIAGNOSIS — Z8639 Personal history of other endocrine, nutritional and metabolic disease: Secondary | ICD-10-CM

## 2020-07-05 DIAGNOSIS — D72829 Elevated white blood cell count, unspecified: Secondary | ICD-10-CM

## 2020-07-05 DIAGNOSIS — I4901 Ventricular fibrillation: Secondary | ICD-10-CM | POA: Diagnosis not present

## 2020-07-05 LAB — POCT I-STAT 7, (LYTES, BLD GAS, ICA,H+H)
Acid-base deficit: 9 mmol/L — ABNORMAL HIGH (ref 0.0–2.0)
Acid-base deficit: 11 mmol/L — ABNORMAL HIGH (ref 0.0–2.0)
Bicarbonate: 16.8 mmol/L — ABNORMAL LOW (ref 20.0–28.0)
Bicarbonate: 16.3 mmol/L — ABNORMAL LOW (ref 20.0–28.0)
Calcium, Ion: 1.47 mmol/L — ABNORMAL HIGH (ref 1.15–1.40)
Calcium, Ion: 1.37 mmol/L (ref 1.15–1.40)
HCT: 43 % (ref 36.0–46.0)
HCT: 40 % (ref 36.0–46.0)
Hemoglobin: 14.6 g/dL (ref 12.0–15.0)
Hemoglobin: 13.6 g/dL (ref 12.0–15.0)
O2 Saturation: 99 %
O2 Saturation: 96 %
Patient temperature: 98.6
Patient temperature: 98.8
Potassium: 2.8 mmol/L — ABNORMAL LOW (ref 3.5–5.1)
Potassium: 2.9 mmol/L — ABNORMAL LOW (ref 3.5–5.1)
Sodium: 141 mmol/L (ref 135–145)
Sodium: 143 mmol/L (ref 135–145)
TCO2: 18 mmol/L — ABNORMAL LOW (ref 22–32)
TCO2: 17 mmol/L — ABNORMAL LOW (ref 22–32)
pCO2 arterial: 36.5 mmHg (ref 32.0–48.0)
pCO2 arterial: 40.3 mmHg (ref 32.0–48.0)
pH, Arterial: 7.271 — ABNORMAL LOW (ref 7.350–7.450)
pH, Arterial: 7.215 — ABNORMAL LOW (ref 7.350–7.450)
pO2, Arterial: 155 mmHg — ABNORMAL HIGH (ref 83.0–108.0)
pO2, Arterial: 95 mmHg (ref 83.0–108.0)

## 2020-07-05 LAB — ECHOCARDIOGRAM COMPLETE
AR max vel: 2.35 cm2
AV Area VTI: 2.06 cm2
AV Area mean vel: 2.26 cm2
AV Mean grad: 6.5 mmHg
AV Peak grad: 12.3 mmHg
Ao pk vel: 1.76 m/s
Area-P 1/2: 2.87 cm2
Height: 68 in
S' Lateral: 3.6 cm
Weight: 2920.65 oz

## 2020-07-05 LAB — BASIC METABOLIC PANEL
Anion gap: 16 — ABNORMAL HIGH (ref 5–15)
Anion gap: 16 — ABNORMAL HIGH (ref 5–15)
Anion gap: 10 (ref 5–15)
BUN: 32 mg/dL — ABNORMAL HIGH (ref 8–23)
BUN: 42 mg/dL — ABNORMAL HIGH (ref 8–23)
BUN: 52 mg/dL — ABNORMAL HIGH (ref 8–23)
CO2: 15 mmol/L — ABNORMAL LOW (ref 22–32)
CO2: 15 mmol/L — ABNORMAL LOW (ref 22–32)
CO2: 16 mmol/L — ABNORMAL LOW (ref 22–32)
Calcium: 11 mg/dL — ABNORMAL HIGH (ref 8.9–10.3)
Calcium: 9.8 mg/dL (ref 8.9–10.3)
Calcium: 8.8 mg/dL — ABNORMAL LOW (ref 8.9–10.3)
Chloride: 111 mmol/L (ref 98–111)
Chloride: 111 mmol/L (ref 98–111)
Chloride: 113 mmol/L — ABNORMAL HIGH (ref 98–111)
Creatinine, Ser: 2.18 mg/dL — ABNORMAL HIGH (ref 0.44–1.00)
Creatinine, Ser: 3.13 mg/dL — ABNORMAL HIGH (ref 0.44–1.00)
Creatinine, Ser: 3.82 mg/dL — ABNORMAL HIGH (ref 0.44–1.00)
GFR, Estimated: 25 mL/min — ABNORMAL LOW (ref >60)
GFR, Estimated: 16 mL/min — ABNORMAL LOW (ref >60)
GFR, Estimated: 13 mL/min — ABNORMAL LOW (ref >60)
Glucose, Bld: 199 mg/dL — ABNORMAL HIGH (ref 70–99)
Glucose, Bld: 238 mg/dL — ABNORMAL HIGH (ref 70–99)
Glucose, Bld: 159 mg/dL — ABNORMAL HIGH (ref 70–99)
Potassium: 2.8 mmol/L — ABNORMAL LOW (ref 3.5–5.1)
Potassium: 2.9 mmol/L — ABNORMAL LOW (ref 3.5–5.1)
Potassium: 4.4 mmol/L (ref 3.5–5.1)
Sodium: 142 mmol/L (ref 135–145)
Sodium: 142 mmol/L (ref 135–145)
Sodium: 139 mmol/L (ref 135–145)

## 2020-07-05 LAB — BETA-HYDROXYBUTYRIC ACID
Beta-Hydroxybutyric Acid: 0.32 mmol/L — ABNORMAL HIGH (ref 0.05–0.27)
Beta-Hydroxybutyric Acid: 0.18 mmol/L (ref 0.05–0.27)

## 2020-07-05 LAB — LACTIC ACID, PLASMA
Lactic Acid, Venous: 5.1 mmol/L (ref 0.5–1.9)
Lactic Acid, Venous: 6.4 mmol/L (ref 0.5–1.9)
Lactic Acid, Venous: 5.9 mmol/L (ref 0.5–1.9)

## 2020-07-05 LAB — RAPID URINE DRUG SCREEN, HOSP PERFORMED
Amphetamines: NOT DETECTED
Barbiturates: NOT DETECTED
Benzodiazepines: POSITIVE — AB
Cocaine: NOT DETECTED
Opiates: NOT DETECTED
Tetrahydrocannabinol: NOT DETECTED

## 2020-07-05 LAB — COOXEMETRY PANEL
Carboxyhemoglobin: 0.6 % (ref 0.5–1.5)
Methemoglobin: 0.9 % (ref 0.0–1.5)
O2 Saturation: 77.7 %
Total hemoglobin: 12.8 g/dL (ref 12.0–16.0)

## 2020-07-05 LAB — CBC
HCT: 44 % (ref 36.0–46.0)
Hemoglobin: 15.2 g/dL — ABNORMAL HIGH (ref 12.0–15.0)
MCH: 31.2 pg (ref 26.0–34.0)
MCHC: 34.5 g/dL (ref 30.0–36.0)
MCV: 90.3 fL (ref 80.0–100.0)
Platelets: 237 10*3/uL (ref 150–400)
RBC: 4.87 MIL/uL (ref 3.87–5.11)
RDW: 12.8 % (ref 11.5–15.5)
WBC: 19.6 10*3/uL — ABNORMAL HIGH (ref 4.0–10.5)
nRBC: 0 % (ref 0.0–0.2)

## 2020-07-05 LAB — GLUCOSE, CAPILLARY
Glucose-Capillary: 130 mg/dL — ABNORMAL HIGH (ref 70–99)
Glucose-Capillary: 132 mg/dL — ABNORMAL HIGH (ref 70–99)
Glucose-Capillary: 170 mg/dL — ABNORMAL HIGH (ref 70–99)
Glucose-Capillary: 183 mg/dL — ABNORMAL HIGH (ref 70–99)
Glucose-Capillary: 205 mg/dL — ABNORMAL HIGH (ref 70–99)
Glucose-Capillary: 208 mg/dL — ABNORMAL HIGH (ref 70–99)
Glucose-Capillary: 225 mg/dL — ABNORMAL HIGH (ref 70–99)

## 2020-07-05 LAB — C DIFFICILE QUICK SCREEN W PCR REFLEX
C Diff antigen: NEGATIVE
C Diff interpretation: NOT DETECTED
C Diff toxin: NEGATIVE

## 2020-07-05 LAB — MRSA NEXT GEN BY PCR, NASAL: MRSA by PCR Next Gen: NOT DETECTED

## 2020-07-05 LAB — TROPONIN I (HIGH SENSITIVITY)
Troponin I (High Sensitivity): 375 ng/L (ref <18)
Troponin I (High Sensitivity): 8487 ng/L (ref <18)
Troponin I (High Sensitivity): 6803 ng/L (ref <18)
Troponin I (High Sensitivity): 20678 ng/L (ref <18)

## 2020-07-05 LAB — T4, FREE: Free T4: 1.05 ng/dL (ref 0.61–1.12)

## 2020-07-05 LAB — PROCALCITONIN: Procalcitonin: 12.99 ng/mL

## 2020-07-05 LAB — MAGNESIUM
Magnesium: 3.3 mg/dL — ABNORMAL HIGH (ref 1.7–2.4)
Magnesium: 3.2 mg/dL — ABNORMAL HIGH (ref 1.7–2.4)
Magnesium: 2.7 mg/dL — ABNORMAL HIGH (ref 1.7–2.4)

## 2020-07-05 LAB — PHOSPHORUS
Phosphorus: 5.9 mg/dL — ABNORMAL HIGH (ref 2.5–4.6)
Phosphorus: 4.4 mg/dL (ref 2.5–4.6)

## 2020-07-05 LAB — TSH: TSH: 0.056 u[IU]/mL — ABNORMAL LOW (ref 0.350–4.500)

## 2020-07-05 LAB — AMMONIA: Ammonia: 139 umol/L — ABNORMAL HIGH (ref 9–35)

## 2020-07-05 LAB — STREP PNEUMONIAE URINARY ANTIGEN: Strep Pneumo Urinary Antigen: NEGATIVE

## 2020-07-05 LAB — CORTISOL: Cortisol, Plasma: 53.7 ug/dL

## 2020-07-05 LAB — HIV ANTIBODY (ROUTINE TESTING W REFLEX): HIV Screen 4th Generation wRfx: NONREACTIVE

## 2020-07-05 MED ORDER — MIDAZOLAM HCL 2 MG/2ML IJ SOLN
2.0000 mg | INTRAMUSCULAR | Status: DC | PRN
  Administered 2020-07-05: 2 mg via INTRAVENOUS
  Filled 2020-07-05: qty 2

## 2020-07-05 MED ORDER — PROSOURCE TF PO LIQD
45.0000 mL | Freq: Two times a day (BID) | ORAL | Status: DC
  Administered 2020-07-05: 45 mL
  Filled 2020-07-05: qty 45

## 2020-07-05 MED ORDER — LEVOTHYROXINE SODIUM 100 MCG PO TABS
100.0000 ug | ORAL_TABLET | Freq: Every day | ORAL | Status: DC
  Administered 2020-07-05: 100 ug
  Filled 2020-07-05: qty 1

## 2020-07-05 MED ORDER — FENTANYL 2500MCG IN NS 250ML (10MCG/ML) PREMIX INFUSION
50.0000 ug/h | INTRAVENOUS | Status: DC
  Administered 2020-07-05: 150 ug/h via INTRAVENOUS
  Administered 2020-07-06: 200 ug/h via INTRAVENOUS
  Filled 2020-07-05 (×2): qty 250

## 2020-07-05 MED ORDER — CHLORHEXIDINE GLUCONATE 0.12% ORAL RINSE (MEDLINE KIT)
15.0000 mL | Freq: Two times a day (BID) | OROMUCOSAL | Status: DC
  Administered 2020-07-05 – 2020-07-06 (×3): 15 mL via OROMUCOSAL

## 2020-07-05 MED ORDER — ROSUVASTATIN CALCIUM 5 MG PO TABS: 5.0000 mg | ORAL_TABLET | Freq: Every day | ORAL | Status: DC

## 2020-07-05 MED ORDER — POTASSIUM CHLORIDE 10 MEQ/100ML IV SOLN
10.0000 meq | INTRAVENOUS | Status: AC
  Administered 2020-07-05: 10 meq via INTRAVENOUS
  Filled 2020-07-05: qty 100

## 2020-07-05 MED ORDER — FENTANYL CITRATE (PF) 100 MCG/2ML IJ SOLN: 50.0000 ug | INTRAMUSCULAR | Status: DC | PRN

## 2020-07-05 MED ORDER — LACTULOSE 10 GM/15ML PO SOLN
30.0000 g | Freq: Three times a day (TID) | ORAL | Status: DC
  Administered 2020-07-05 (×4): 30 g via ORAL
  Filled 2020-07-05 (×4): qty 45

## 2020-07-05 MED ORDER — MIDAZOLAM HCL 2 MG/2ML IJ SOLN
4.0000 mg | INTRAMUSCULAR | Status: DC | PRN
  Administered 2020-07-05 – 2020-07-06 (×3): 4 mg via INTRAVENOUS
  Filled 2020-07-05 (×3): qty 4

## 2020-07-05 MED ORDER — SODIUM CHLORIDE 0.9 % IV SOLN
INTRAVENOUS | Status: DC | PRN
  Administered 2020-07-05: 500 mL via INTRAVENOUS

## 2020-07-05 MED ORDER — POTASSIUM CHLORIDE 10 MEQ/100ML IV SOLN
10.0000 meq | INTRAVENOUS | Status: AC
  Administered 2020-07-05 (×2): 10 meq via INTRAVENOUS
  Filled 2020-07-05 (×2): qty 100

## 2020-07-05 MED ORDER — FENTANYL CITRATE (PF) 100 MCG/2ML IJ SOLN: 50.0000 ug | Freq: Once | INTRAMUSCULAR | Status: DC

## 2020-07-05 MED ORDER — ORAL CARE MOUTH RINSE
15.0000 mL | OROMUCOSAL | Status: DC
  Administered 2020-07-05 – 2020-07-06 (×10): 15 mL via OROMUCOSAL

## 2020-07-05 MED ORDER — ASPIRIN 81 MG PO CHEW
81.0000 mg | CHEWABLE_TABLET | Freq: Every day | ORAL | Status: DC
  Administered 2020-07-05: 81 mg
  Filled 2020-07-05: qty 1

## 2020-07-05 MED ORDER — SODIUM CHLORIDE 0.9 % IV SOLN
1.5000 g | Freq: Two times a day (BID) | INTRAVENOUS | Status: DC
  Administered 2020-07-05 (×2): 1.5 g via INTRAVENOUS
  Filled 2020-07-05 (×4): qty 4

## 2020-07-05 MED ORDER — LACTATED RINGERS IV BOLUS
1000.0000 mL | Freq: Once | INTRAVENOUS | Status: AC
  Administered 2020-07-05: 1000 mL via INTRAVENOUS

## 2020-07-05 MED ORDER — MAGNESIUM SULFATE 2 GM/50ML IV SOLN: 2.0000 g | Freq: Once | INTRAVENOUS | Status: DC

## 2020-07-05 MED ORDER — CHLORHEXIDINE GLUCONATE CLOTH 2 % EX PADS: 6.0000 | MEDICATED_PAD | Freq: Every day | CUTANEOUS | Status: DC

## 2020-07-05 MED ORDER — LACTULOSE 10 GM/15ML PO SOLN: 30.0000 g | Freq: Three times a day (TID) | ORAL | Status: DC

## 2020-07-05 MED ORDER — PROPOFOL 1000 MG/100ML IV EMUL
INTRAVENOUS | Status: AC
  Filled 2020-07-05: qty 100

## 2020-07-05 MED ORDER — MIDAZOLAM HCL 2 MG/2ML IJ SOLN
2.0000 mg | INTRAMUSCULAR | Status: DC | PRN
  Filled 2020-07-05 (×2): qty 2

## 2020-07-05 MED ORDER — POTASSIUM CHLORIDE 20 MEQ PO PACK
40.0000 meq | PACK | Freq: Two times a day (BID) | ORAL | Status: DC
  Administered 2020-07-05: 40 meq
  Filled 2020-07-05: qty 2

## 2020-07-05 MED ORDER — FENTANYL 2500MCG IN NS 250ML (10MCG/ML) PREMIX INFUSION
0.0000 ug/h | INTRAVENOUS | Status: DC
  Administered 2020-07-05: 25 ug/h via INTRAVENOUS
  Filled 2020-07-05: qty 250

## 2020-07-05 MED ORDER — LEVOTHYROXINE SODIUM 100 MCG PO TABS: 100.0000 ug | ORAL_TABLET | Freq: Every day | ORAL | Status: DC

## 2020-07-05 MED ORDER — VITAL AF 1.2 CAL PO LIQD
1000.0000 mL | ORAL | Status: DC
  Administered 2020-07-05: 1000 mL

## 2020-07-05 MED ORDER — DOCUSATE SODIUM 50 MG/5ML PO LIQD
100.0000 mg | Freq: Two times a day (BID) | ORAL | Status: DC
  Administered 2020-07-05: 100 mg
  Filled 2020-07-05: qty 10

## 2020-07-05 MED ORDER — CHLORHEXIDINE GLUCONATE CLOTH 2 % EX PADS
6.0000 | MEDICATED_PAD | Freq: Every day | CUTANEOUS | Status: DC
  Administered 2020-07-06: 6 via TOPICAL

## 2020-07-05 MED ORDER — POLYETHYLENE GLYCOL 3350 17 G PO PACK: 17.0000 g | PACK | Freq: Every day | ORAL | Status: DC

## 2020-07-05 MED ORDER — FENTANYL BOLUS VIA INFUSION
50.0000 ug | INTRAVENOUS | Status: DC | PRN
  Administered 2020-07-05 (×6): 100 ug via INTRAVENOUS
  Filled 2020-07-05: qty 100

## 2020-07-05 MED ORDER — POTASSIUM CHLORIDE 20 MEQ PO PACK: 40.0000 meq | PACK | Freq: Once | ORAL | Status: DC

## 2020-07-05 MED ORDER — INSULIN ASPART 100 UNIT/ML IJ SOLN
0.0000 [IU] | INTRAMUSCULAR | Status: DC
  Administered 2020-07-05: 5 [IU] via SUBCUTANEOUS
  Administered 2020-07-05: 2 [IU] via SUBCUTANEOUS
  Administered 2020-07-05: 3 [IU] via SUBCUTANEOUS
  Administered 2020-07-05: 2 [IU] via SUBCUTANEOUS
  Administered 2020-07-05: 3 [IU] via SUBCUTANEOUS
  Administered 2020-07-05: 5 [IU] via SUBCUTANEOUS

## 2020-07-05 MED ORDER — PERFLUTREN LIPID MICROSPHERE
1.0000 mL | INTRAVENOUS | Status: AC | PRN
  Administered 2020-07-05: 5 mL via INTRAVENOUS
  Filled 2020-07-05: qty 10

## 2020-07-05 MED ORDER — VITAL HIGH PROTEIN PO LIQD
1000.0000 mL | ORAL | Status: DC
  Filled 2020-07-05: qty 1000

## 2020-07-05 MED ORDER — DEXMEDETOMIDINE HCL IN NACL 400 MCG/100ML IV SOLN
0.4000 ug/kg/h | INTRAVENOUS | Status: DC
  Administered 2020-07-05: 0.4 ug/kg/h via INTRAVENOUS
  Filled 2020-07-05: qty 100

## 2020-07-05 MED ORDER — POTASSIUM CHLORIDE 20 MEQ PO PACK
40.0000 meq | PACK | Freq: Two times a day (BID) | ORAL | Status: AC
  Administered 2020-07-05: 40 meq
  Filled 2020-07-05: qty 2

## 2020-07-05 MED ORDER — HEPARIN (PORCINE) 25000 UT/250ML-% IV SOLN
950.0000 [IU]/h | INTRAVENOUS | Status: DC
  Administered 2020-07-05: 950 [IU]/h via INTRAVENOUS
  Filled 2020-07-05: qty 250

## 2020-07-05 MED ORDER — MIDAZOLAM HCL 2 MG/2ML IJ SOLN
4.0000 mg | Freq: Once | INTRAMUSCULAR | Status: AC
  Administered 2020-07-05: 4 mg via INTRAVENOUS

## 2020-07-05 MED ORDER — POTASSIUM CHLORIDE 10 MEQ/100ML IV SOLN
10.0000 meq | INTRAVENOUS | Status: DC
  Administered 2020-07-05: 10 meq via INTRAVENOUS
  Filled 2020-07-05: qty 100

## 2020-07-05 MED ORDER — MIDAZOLAM HCL 2 MG/2ML IJ SOLN
INTRAMUSCULAR | Status: AC
  Administered 2020-07-05: 2 mg via INTRAVENOUS
  Filled 2020-07-05: qty 2

## 2020-07-05 NOTE — Progress Notes (Signed)
Patient transported from ED Trauma A to 3M13 with no complications.

## 2020-07-05 NOTE — Procedures (Signed)
Cortrak ° °Person Inserting Tube:  Summer Sexton, RD °Tube Type:  Cortrak - 43 inches °Tube Size:  10 °Tube Location:  Left nare °Initial Placement:  Stomach °Secured by: Bridle °Technique Used to Measure Tube Placement:  Marking at nare/corner of mouth °Cortrak Secured At:  69 cm °Procedure Comments:  Cortrak Tube Team Note: ° °Consult received to place a Cortrak feeding tube.  ° °X-ray is required, abdominal x-ray has been ordered by the Cortrak team. Please confirm tube placement before using the Cortrak tube.  ° °If the tube becomes dislodged please keep the tube and contact the Cortrak team at [email or web address] (password [email or web address]) for replacement.  °If after hours and replacement cannot be delayed, place a NG tube and confirm placement with an abdominal x-ray.  ° ° Summer Lotito MS, RDN, LDN, CNSC °Registered Dietitian III °Clinical Nutrition °RD Pager and On-Call Pager Number Located in Amion  ° ° ° ° °

## 2020-07-05 NOTE — Progress Notes (Signed)
ANTICOAGULATION CONSULT NOTE - Initial Consult  Pharmacy Consult for IV Heparin Indication: chest pain/ACS  No Known Allergies  Patient Measurements: Height: 5\' 8"  (172.7 cm) Weight: 82.8 kg (182 lb 8.7 oz) IBW/kg (Calculated) : 63.9 Heparin Dosing Weight:  80.8 kg  Vital Signs: Temp: 99.8 F (37.7 C) (06/29 1528) Temp Source: Axillary (06/29 1528) BP: 126/70 (06/29 0900) Pulse Rate: 79 (06/29 1600)  Labs: Recent Labs    2020/07/30 2206 Jul 30, 2020 2231 Jul 30, 2020 2233 07/05/20 0255 07/05/20 0314 07/05/20 0405 07/05/20 1112 07/05/20 1127  HGB 12.3 12.6   < > 14.6 15.2*  --   --  13.6  HCT 38.9 37.0   < > 43.0 44.0  --   --  40.0  PLT 210  --   --   --  237  --   --   --   CREATININE 1.97* 1.80*  --   --  2.18*  --  3.13*  --   TROPONINIHS 375*  --   --   --  07/07/20* 6,803* 20,678*  --    < > = values in this interval not displayed.    Estimated Creatinine Clearance: 20.8 mL/min (A) (by C-G formula based on SCr of 3.13 mg/dL (H)).   Medical History: Past Medical History:  Diagnosis Date   Arthritis    Fibromyalgia    Hypertension    Kidney disease    Liver disease    Murmur    Right bundle branch block    Assessment: 65 yr old female with CKD presented to ED on 07/30/2020 following unwitnessed out of hospital cardiac arrest with v fib requiring multiple defibrillation by EMS and was diagnosed with NSTEMI. Pharmacy is consulted for IV heparin dosing; pt was not on anticoagulant PTA.   H/H 13.6/40.0, plt 237; Scr rising since admission, now up to 3.13  Pt rec'd heparin 5000 units SQ at 1310 this afternoon.  Cardiology is recommending cardiac cath  Goal of Therapy:  Heparin level 0.3-0.7 units/ml Monitor platelets by anticoagulation protocol: Yes   Plan:  Heparin infusion at 950 units/hr (will omit bolus since pt rec'd SQ heparin this afternoon and renal function poor/worsening) Check heparin level in 8 hrs Monitor daily heparin level, CBC Monitor for  bleeding  06-19-1986, PharmD, BCPS, [facility] Clinical Pharmacist 07/05/2020,5:35 PM

## 2020-07-05 NOTE — Assessment & Plan Note (Signed)
Normal baseline creatinine around 1.09; 6 months ago, however GFR was 55, serum creatinine climbing up to 2.18 suspect exacerbated by ongoing shock Plan Continuing supportive care Renal dose medications Holding any antihypertensives note she was on ARB/hydrochlorothiazide at home Strict intake output Ensure adequate volume status

## 2020-07-05 NOTE — Progress Notes (Signed)
S:  Called to assess EKG for changes  O: Blood pressure 126/70, pulse 81, temperature 99.8 F (37.7 C), temperature source Axillary, resp. rate 16, height 5\' 8"  (1.727 m), weight 82.8 kg, SpO2 94 %.  EKG reviewed at bedside, noted movement in baseline, prolonged QTc.  Medications reviewed, no offending QTc prolonging agents.  Noted hypokalemia.  No acute ST changes on EKG, reviewed with Dr. .     A: Hypokalemia Post Cardiac Arrest  Shock on Pressors AKI  P: -now 20 mEq KCL IV  -adjust time on prior KCL order to given Excell Seltzer PT -follow up Mg, K at 8pm  -follow Co-Ox, CVP -heparin gtt per Cardiology -continue current support    Additional Critical Care Time: 30 minutes   , MSN, APRN, NP-C, AGACNP-BC [facility] 07/05/2020, 6:21 PM   Please see [email or web address] for pager details.   From 7A-7P if no response, please call [phone] After hours, please call ELink [phone]

## 2020-07-05 NOTE — Assessment & Plan Note (Signed)
Initial PCXR ett good position.  Bibasilar volume loss.  Cannot exclude an infiltrate.  Certainly has a good story for aspiration given prolonged CPR Plan Continue full ventilator support Repeat arterial blood gas later this morning, as metabolic status improves suspect we will be over ventilating her Follow-up respiratory culture Antibiotics addressed in shock section VAP bundle A.m. chest x-ray

## 2020-07-05 NOTE — Assessment & Plan Note (Signed)
Plan C. difficile pending

## 2020-07-05 NOTE — Assessment & Plan Note (Signed)
Likely reactive however cannot exclude infection given elevated procalcitonin Plan Trend CBC Follow-up cultures, as well as urine Legionella and strep Unasyn day #1

## 2020-07-05 NOTE — Progress Notes (Signed)
Inpatient Diabetes Program Recommendations  AACE/ADA: New Consensus Statement on Inpatient Glycemic Control (2015)  Target Ranges:  Prepandial:   less than 140 mg/dL      Peak postprandial:   less than 180 mg/dL (1-2 hours)      Critically ill patients:  140 - 180 mg/dL   Results for Summer Sexton, Summer Sexton (MRN [id number]) as of 07/05/2020 11:55  Ref. Range 07/05/2020 00:48 07/05/2020 03:32 07/05/2020 07:38 07/05/2020 11:48  Glucose-Capillary Latest Ref Range: 70 - 99 mg/dL 353 (H) 299 (H)  3 units NOVOLOG  208 (H)  5 units NOVOLOG  205 (H)  5 units NOVOLOG       Admit Witnessed Cardiac Arrest at Home--Husband started CPR   No History of Diabetes   A1c Pending   Current Orders: Novolog 0-15 units Q4H (started at 4am today)    MD- Note CBGs low 200s.  Note Tube Feeds to start this afternoon.  Please consider adding split dose basal insulin: Lantus 5 units BID (~0.15 units/kg)  If CBGs rise after addition of tube feedings, may also consider starting low dose Novolog tube feed coverage: Novolog 3 units Q4 hours HOLD if tube feeds HELD for any reason     --Will follow patient during hospitalization--  Ambrose Finland RN, MSN, CDE Diabetes Coordinator Inpatient Glycemic Control Team Team Pager: [phone] (8a-5p)

## 2020-07-05 NOTE — Progress Notes (Signed)
eLink Physician-Brief Progress Note Patient Name: Summer Sexton DOB: 1955/03/26 MRN: [id number]   Date of Service  07/05/2020  HPI/Events of Note  K+ 2.8  eICU Interventions  K+ replacement order changed to 20 meq iv Q 1 hour x 2 plus 40 meq via NG tube Q 12 hours x 2.        Thomasene Lot Farha Dano 07/05/2020, 3:06 AM

## 2020-07-05 NOTE — Assessment & Plan Note (Addendum)
Likely status post cardiac arrest, however Abdominal ultrasound showed increased hepatic echogenicity consistent with fatty infiltrate versus hepatocellular disease Plan Follow-up a.m. LFTs none Follow-up abdominal ultrasound 3 months

## 2020-07-05 NOTE — Procedures (Addendum)
Arterial Catheter Insertion Procedure Note  Kalandra Masters  [id number]  June 18, 1955  Date:07/05/20  Time:10:11 AM    Provider Performing: Jacqulynn Cadet    Procedure: Insertion of Arterial Line (70962) without US guidance  Indication(s) Blood pressure monitoring and/or need for frequent ABGs  Consent Obtained  Anesthesia None   Time Out Verified patient identification, verified procedure, site/side was marked, verified correct patient position, special equipment/implants available, medications/allergies/relevant history reviewed, required imaging and test results available.   Sterile Technique Maximal sterile technique including full sterile barrier drape, hand hygiene, sterile gown, sterile gloves, mask, hair covering, sterile ultrasound probe cover (if used).   Procedure Description Area of catheter insertion was cleaned with chlorhexidine and draped in sterile fashion. Without real-time ultrasound guidance an arterial catheter was placed into the right radial artery.  Appropriate arterial tracings confirmed on monitor.     Complications/Tolerance None; patient tolerated the procedure well.   EBL Minimal   Specimen(s) None

## 2020-07-05 NOTE — Progress Notes (Signed)
Nutrition Follow-up  DOCUMENTATION CODES:   Not applicable  INTERVENTION:   Tube feeding via OG tube: - Vital AF 1.2 @ 60 ml/hr (1440 ml/day)  Tube feeding regimen provides 1728 kcal, 108 grams of protein, and 1168 ml of H2O.   NUTRITION DIAGNOSIS:   Inadequate oral intake related to inability to eat as evidenced by NPO status.  GOAL:   Patient will meet greater than or equal to 90% of their needs  MONITOR:   Labs, Weight trends, TF tolerance, Vent status, I & O's  REASON FOR ASSESSMENT:   Ventilator, Consult Enteral/tube feeding initiation and management  ASSESSMENT:   65 year old female who presented to the ED on 6/28 after cardiac arrest. PMH of hypothyroidism, HLD, HTN, CKD. Pt required intubation.  6/29 - Cortrak placed (tip gastric)  Discussed pt with RN and during ICU rounds. Pt had prolonged CPR reported with Vfib with defibrillations. Pt is intermittently agitated. Per notes, pt now with cardiogenic shock, AKI. There is concern for cardiac etiology of arrest. Cardiology consulted.  Consult received for tube feeding initiation and management. Pt had OGT but this was replaced with a Cortrak today.  Spoke with pt's daughter and husband at bedside. Pt's husband reports that pt had a good appetite PTA and had actually just cooked spaghetti for dinner last night. Pt's husband reports pt had a sedentary job but was trying to get out and walk more. He reports no recent changes in pt's weight.  Patient is currently intubated on ventilator support MV: 10.2 L/min Temp (24hrs), Avg:97.1 F (36.2 C), Min:95.4 F (35.2 C), Max:98.8 F (37.1 C)  Drips: Fentanyl Epinephrine  Medications reviewed and include: colace, SSI q 4 hours, lactulose, protonix, miralax, klor-con 40 mEq x 2, IV abx  Labs reviewed: potassium 2.8, BUN 32, creatinine 2.18, magnesium 3.3, ammonia 139, lactic acid 5.1 CBG's: 170-225 x 24 hours  I/O's: +1.5 L since admit  NUTRITION - FOCUSED  PHYSICAL EXAM:  Flowsheet Row Most Recent Value  Orbital Region No depletion  Upper Arm Region No depletion  Thoracic and Lumbar Region No depletion  Buccal Region Unable to assess  Temple Region No depletion  Clavicle Bone Region No depletion  Clavicle and Acromion Bone Region No depletion  Scapular Bone Region No depletion  Dorsal Hand No depletion  Patellar Region No depletion  Anterior Thigh Region No depletion  Posterior Calf Region No depletion  Edema (RD Assessment) Moderate  [BLE]  Hair Reviewed  Eyes Unable to assess  Mouth Unable to assess  Skin Reviewed  Nails Reviewed       Diet Order:   Diet Order             Diet NPO time specified  Diet effective now                   EDUCATION NEEDS:   No education needs have been identified at this time  Skin:  Skin Assessment: Reviewed RN Assessment  Last BM:  07/05/20 type 7 via rectal tube  Height:   Ht Readings from Last 1 Encounters:  06/18/2020 5\' 8"  (1.727 m)    Weight:   Wt Readings from Last 1 Encounters:  07/05/20 82.8 kg    BMI:  Body mass index is 27.76 kg/m.  Estimated Nutritional Needs:   Kcal:  1650-1850  Protein:  100-115 grams  Fluid:  1.6-1.8 L    07/07/20, MS, RD, LDN Inpatient Clinical Dietitian Please see AMiON for contact information.

## 2020-07-05 NOTE — Procedures (Signed)
Central Venous Catheter Insertion Procedure Note  Summer Sexton  [id number]  08/16/1955  Date:07/05/20  Time:12:29 PM   Provider Performing:Navie Lamoreaux Hezzie Bump   Procedure: Insertion of Non-tunneled Central Venous [id number]) with US guidance (33354)   Indication(s) Medication administration  Consent Risks of the procedure as well as the alternatives and risks of each were explained to the patient and/or caregiver.  Consent for the procedure was obtained and is signed in the bedside chart  Anesthesia Topical only with 1% lidocaine   Timeout Verified patient identification, verified procedure, site/side was marked, verified correct patient position, special equipment/implants available, medications/allergies/relevant history reviewed, required imaging and test results available.  Sterile Technique Maximal sterile technique including full sterile barrier drape, hand hygiene, sterile gown, sterile gloves, mask, hair covering, sterile ultrasound probe cover (if used).  Procedure Description Area of catheter insertion was cleaned with chlorhexidine and draped in sterile fashion.  With real-time ultrasound guidance a central venous catheter was placed into the left internal jugular vein. Nonpulsatile blood flow and easy flushing noted in all ports.  The catheter was sutured in place and sterile dressing applied.  Complications/Tolerance None; patient tolerated the procedure well. Chest X-ray is ordered to verify placement for internal jugular or subclavian cannulation.   Chest x-ray is not ordered for femoral cannulation.  EBL Minimal  Specimen(s) None  Gershon Mussel., MSN, APRN, AGACNP-BC [facility]  07/05/2020 , 12:30 PM  Please see [email or web address] for pager details  If no response, please call [phone] After hours, please call Elink at [phone]

## 2020-07-05 NOTE — Progress Notes (Signed)
PIV consult: 2.5" 20g placed in R cephalic vein. L AC site infusing. Bilateral forearms with large bruises at sites of previous venipuncture. Initial 20 insertion in R forearm dislodged with pt movement after labs drawn. Please consider central line for prolonged infusions.

## 2020-07-05 NOTE — Progress Notes (Addendum)
NAME:  Summer Sexton, MRN:  [id number], DOB:  Nov 21, 1955, LOS: 1 ADMISSION DATE:  July 17, 2020, CONSULTATION DATE:  6/29 REFERRING MD:  Dr. Madilyn Hook, CHIEF COMPLAINT:  post cardiac arrest   History of Present Illness:  Patient is a 65 yo F w/ pertinent PMH of hypothyroidism, HLD, HTN w/ kidney disease, and murmur presents to [facility] on 6/29 w/ cardiac arrest.  Patient was found down by husband and he started CPR. Firefighters arrived on scene and continued CPR. EMS arrived and continued CPR, placed ETT, and transported patient to [facility]. Total CPR time about 1 hour until ROSC. Purposeful movement noticed by EMS post ROSC. She received multiple defibrillations for vfib rhythm and was treated with 9 epi, 450 amio, 2 g mag, 2 g bicarb, 2.5 Versed, 100 lidocaine, 50 fentanyl, 6 g epi drip.   PCCM consulted for icu admission and medical management.  Pertinent  Medical History   Past Medical History:  Diagnosis Date   Arthritis    Fibromyalgia    Hypertension    Kidney disease    Liver disease    Murmur    Right bundle branch block      Significant Hospital Events: Including procedures, antibiotic start and stop dates in addition to other pertinent events   6/29: admitted to [facility]; post cardiac arrest;  ~40 min to 1 hour ROSC; vfib rhythm; multiple shocks; epi, bicarb, calcium, amio, mag, and lidocaine given. Intubated on epi drip. Non responsive to painful stimuli. But per EMS was purposeful. CT brain neg for bleed or clear infarcts. Abd [place] questioning fatty liver disease vs hepatocellular disease  6/29 CVL for shock. Cultures sent. Starting Unasyn for aspiration   Interim History / Subjective:  Still  in shock   Objective   Blood pressure 102/68, pulse (Abnormal) 58, temperature (Abnormal) 96.7 F (35.9 C), temperature source Axillary, resp. rate 20, height 5\' 8"  (1.727 m), weight 82.8 kg, SpO2 98 %.    Vent Mode: PRVC FiO2 (%):  [30 %-100 %] 30 % Set Rate:  [20 bmp] 20 bmp Vt Set:  [510 mL] 510  mL PEEP:  [5 cmH20] 5 cmH20 Plateau Pressure:  [16 cmH20-22 cmH20] 16 cmH20   Intake/Output Summary (Last 24 hours) at 07/05/2020 0840 Last data filed at 07/05/2020 0700 Gross per 24 hour  Intake 451.55 ml  Output 85 ml  Net 366.55 ml   Filed Weights   07/05/20 0215  Weight: 82.8 kg    Examination:  general: acutely ill appearing, well nourished , currently in acute distress, appears stated age, confused, and agitated HENT: Normocephalic, without obvious abnormality, atraumatic, Sclerae anicteric, Moist Conjunctiva , and PERRL, mouth: mucous membranes moist and orally intubated, neck: trachea midline and no JVD Lungs: lungs: not labored, clear to ascultation , and currently on PRVC mode of ventilation: VT: 510 RR: 20, PEEP5, FIO230 Heart: Heart sounds are normal.  Regular rate and rhythm without murmur, gallop or rub. Rhythm: regular Heart sounds: Normal S1, S2 Soft, non-tender, normal bowel sounds; no bruits, organomegaly or masses. Extremities: normal strength, tone, and muscle mass, no deformities, no erythema, induration, or nodules, ROM of all joints is normal trace to 1+ bilateral pedal edema Skin: there are no suspicious lesions or rashes of concern Neuro: PERLA and restless, purposeful, but not directable and can't follow commands. Moves all ext. Easily awakens but agitated  GU: foley in place    Resolved Hospital Problem list     Assessment & Plan:  Cardiac arrest [facility]) Out of  Hospital cardiac arrest. Has h/o HTN and cardiac murmur, trop starting to trend down.  Plan ECHO Tele  [place] VT Cycle CEs Cards consult  Cardiogenic shock [facility]) Post cardiac arrest cardiogenic shock. Remains on epinephrine gtt. Has elevated PCT so can't exclude element of sepsis. Also ? Drug effect on hypotension from precedex Plan Cont epi gtt, SBP goal >90, MAP > 65 Central access; get CVP: goal euvolemic  Repeat lactate  Hold anithypertensives Ck cortisol Initiate empiric  antibiotics  Acute respiratory failure with hypoxemia (HCC) Initial PCXR ett good position.  Bibasilar volume loss.  Cannot exclude an infiltrate.  Certainly has a good story for aspiration given prolonged CPR Plan Continue full ventilator support Repeat arterial blood gas later this morning, as metabolic status improves suspect we will be over ventilating her Follow-up respiratory culture Antibiotics addressed in shock section VAP bundle A.m. chest x-ray   Electrolyte imbalance:Hypokalemia Plan Replace potassium Follow-up chemistry   Acute renal failure superimposed on stage 2 chronic kidney disease (HCC) Normal baseline creatinine around 1.09; 6 months ago, however GFR was 55, serum creatinine climbing up to 2.18 suspect exacerbated by ongoing shock Plan Continuing supportive care Renal dose medications Holding any antihypertensives note she was on ARB/hydrochlorothiazide at home Strict intake output Ensure adequate volume status  Elevated LFTs Likely status post cardiac arrest, however Abdominal ultrasound showed increased hepatic echogenicity consistent with fatty infiltrate versus hepatocellular disease Plan Follow-up a.m. LFTs none Follow-up abdominal ultrasound 3 months  Lactic acid acidosis In the setting of cardiac arrest and shock.  Also has mild elevation of beta hydroxybutyric acid which suggest mild component of DKA however lactate primary driving issue in her anion gap metabolic acidosis Plan Continue supportive care Treating shock Repeat arterial blood gas later today Repeat lactate and beta hydroxybutyric acid later today  Acute metabolic encephalopathy Following cardiac arrest.  Downtime with CPR estimated at 43 minutes however was apparently propofol post arrest CT brain normal Plan Supportive care PAD protocol RASS goal 0 to -1 Avoid fever Serial neuro checks Lactulose addressed below Follow-up EEG   Hyperglycemia No noted history of  diabetes Plan CBGs every 4 Sliding scale insulin Follow-up A1c  Hyperammonemia (HCC) Significance unclear Plan Follow-up ammonia in a.m. Add lactulose  Hypothyroidism Plan TSH pending Continue Synthroid via tube  Diarrhea Plan C. difficile pending  History of depression Plan Cont cymbalta   History of hyperlipidemia Plan Holding crestor for now  Resume when LFTs normalize   Leukocytosis Likely reactive however cannot exclude infection given elevated procalcitonin Plan Trend CBC Follow-up cultures, as well as urine Legionella and strep Unasyn day #1   Best Practice (right click and "Reselect all SmartList Selections" daily)   Diet/type: tubefeeds DVT prophylaxis: prophylactic heparin  GI prophylaxis: PPI Lines: Central line and Arterial Line Foley:  Yes, and it is still needed Code Status:  full code Last date of multidisciplinary goals of care discussion [6/29: husband and son updated at bedside]  My cct 45 minutes.  Simonne Martinet ACNP-BC [facility] Pager # [phone] OR # [phone] if no answer

## 2020-07-05 NOTE — Assessment & Plan Note (Signed)
Plan Holding crestor for now  Resume when LFTs normalize

## 2020-07-05 NOTE — Progress Notes (Signed)
EEG complete - results pending 

## 2020-07-05 NOTE — Assessment & Plan Note (Signed)
Plan Replace potassium Follow-up chemistry

## 2020-07-05 NOTE — Consult Note (Addendum)
Cardiology Consultation:   Patient ID: Summer Sexton MRN: [id number]; DOB: 08/15/1955  Admit date: 06/13/2020 Date of Consult: 07/05/2020  PCP:  Fanny Bien, MD   [facility] Providers Cardiologist:  New to Dr. Burt Knack   Patient Profile:   Summer Sexton is a 65 y.o. female with a hx of HTN, HLD, hypothyroidism, CKD stage III, fatty liver disease, cardiac murmur, hx of DIC in 1960s while giving birth, fibromyalgia,  who is being seen 07/05/2020 for the evaluation of cardiac arrest at the request of Dr. Hortense Ramal.   History of Present Illness:   Summer Sexton has known cardiac murmur reported, no previous cardiac disease.   She presented to ER 06/17/2020 following cardiac arrest. Per chart review, husband noted patient was unwell, fatigued yesterday. She was downstairs at home,  her husband heard her fall and found her unresponsive on the ground within 2 minutes. Husband called 69 and started CPR. EMS arrived and continued CRP, placed ETT, and defibrillated the patient multiple times. Patient was coded for 40 minutes to an hour, had subsequent ROSC with purposeful movement per EMS report. She did receive 9 epi, 450 amio, 2 g mag, 2 g bicarb, 2.5 Versed, 100 lidocaine, 50 fentanyl, 6 g epi drip. She was subsequently transferred to [facility] ER.   Per husband, patient is typically active at home, was able to walk a mile the day before cardiac arrest, had some fatigue, but nothing unusual. She did not have any complaints such as chest pain, SOB, dizziness. She has never been diagnosed with diabetes, CAD, MI, arrhythmia. She had a cardiac murmur for unknown duration of time. She does not smoking tobacco and use illicit drugs, she rarely drinks ETOH. Husband states EMS did not think she was [place] make it at the scene, and resuscitation took a long time. He expressed concern that she has not had any meaningful  recovery on exam and did not want her to suffer too long. He states patient has not made any meaningful eye  contact or response to external stimuli so far, was increasingly agitated earlier requiring sedation.   Upon arrival here, CXR and CT head showed no acute findings. Admission diagnostic work-up revealed hyperglycemia 315, BUN 25, creatinine 1.97, bicarb 13, anion gap 22, AST 351, ALT 377, GFR 28.  CBC with leukocytosis of WBC 19,300.  Flu and COVID-negative.  UA + glucose, hemoglobin, protein, bacteria.  EKG revealed sinus rhythm, ventricular rate of 87 bpm, IVCD, nonspecific change. High sensitive troponin 375 >8487.  ABG revealed metabolic acidosis.  Ionized calcium WNL.  Beta hydroxybutyrate minimally elevated 0.32.  Patient is subsequently admitted to ICU.   During ICU course, she was hypotensive, required epinephrine support. She is maintained on mechanical ventilation, required Precedex and fentanyl for sedation.  Procalcitonin elevated >12.  She was started on Unasyn for presumed aspiration.  She is given IV fluids for volume resuscitation and AKI, renal index continue to rise, home meds ARB and hydrochlorothiazide had been held.  Lactic acid 6.4 >5.1 >5.9 , Troponin down trended to 6803. TSH suppressed 0.056.  LFTs has not been trended, abdominal ultrasound 6/29 revealed increased hepatic echogenicity consistent with fatty infiltration or hepatocellular disease.  She remains encephalopathic, EEG completed 6/29 revealed continuous generalized slowing suggests moderate to severe diffuse encephalopathy, no seizure or epileptic discharge noted.  She is started on insulin regimen for hyperglycemia, hemoglobin A1c is pending.  She is started on tube feeds.  Echocardiogram is pending.  Cardiology is consulted for further input.  Past Medical History:  Diagnosis Date   Arthritis    Fibromyalgia    Hypertension    Kidney disease    Liver disease    Murmur    Right bundle branch block     Past Surgical History:  Procedure Laterality Date   CESAREAN SECTION     KNEE SURGERY     PARTIAL  HYSTERECTOMY       Home Medications:  Prior to Admission medications   Medication Sig Start Date End Date Taking? Authorizing Provider  acetaminophen (TYLENOL) 500 MG tablet Take 500-1,000 mg by mouth every 6 (six) hours as needed for moderate pain.   Yes [provider]  cetirizine (ZYRTEC) 10 MG tablet Take 10 mg by mouth daily as needed for allergies.   Yes [provider]  DULoxetine (CYMBALTA) 60 MG capsule Take 60 mg by mouth daily. 04/10/20  Yes [provider]  ibuprofen (ADVIL,MOTRIN) 200 MG tablet Take 200 mg by mouth every 6 (six) hours as needed for headache or mild pain.   Yes [provider]  levothyroxine (SYNTHROID) 100 MCG tablet Take 100 mcg by mouth daily. 04/10/20  Yes [provider]  olmesartan-hydrochlorothiazide (BENICAR HCT) 40-25 MG tablet Take 1 tablet by mouth daily. 06/23/20  Yes [provider]  rosuvastatin (CRESTOR) 5 MG tablet Take 5 mg by mouth at bedtime. 04/10/20  Yes [provider]  benzonatate (TESSALON) 100 MG capsule Take 1-2 capsules (100-200 mg total) by mouth 3 (three) times daily as needed for cough. Patient not taking: No sig reported 03/23/17   Jaynee Eagles, PA-C  traMADol (ULTRAM) 50 MG tablet Take 1 tablet (50 mg total) by mouth every 6 (six) hours as needed. Patient not taking: Reported on 06/22/2020 08/26/15   Jacqualine Mau, NP    Inpatient Medications: Scheduled Meds:  aspirin  81 mg Per Tube Daily   chlorhexidine gluconate (MEDLINE KIT)  15 mL Mouth Rinse BID   Chlorhexidine Gluconate Cloth  6 each Topical Daily   Chlorhexidine Gluconate Cloth  6 each Topical Q0600   docusate  100 mg Per Tube BID   feeding supplement (PROSource TF)  45 mL Per Tube BID   feeding supplement (VITAL HIGH PROTEIN)  1,000 mL Per Tube Q24H   fentaNYL (SUBLIMAZE) injection  50 mcg Intravenous Once   heparin  5,000 Units Subcutaneous Q8H   insulin aspart  0-15 Units Subcutaneous Q4H   lactulose  30 g  Oral TID   levothyroxine  100 mcg Per Tube Daily   mouth rinse  15 mL Mouth Rinse 10 times per day   pantoprazole sodium  40 mg Per Tube Daily   polyethylene glycol  17 g Per Tube Daily   potassium chloride  40 mEq Per Tube BID   Continuous Infusions:  sodium chloride 500 mL (07/05/20 1245)   ampicillin-sulbactam (UNASYN) IV     epinephrine 18 mcg/min (07/05/20 1239)   fentaNYL infusion INTRAVENOUS 125 mcg/hr (07/05/20 1107)   lactated ringers     lactated ringers 10 mL/hr at 07/05/20 1100   propofol     PRN Meds: sodium chloride, fentaNYL, midazolam, midazolam, perflutren lipid microspheres (DEFINITY) IV suspension  Allergies:   No Known Allergies  Social History:   Social History   Socioeconomic History   Marital status: Married    Spouse name: Not on file   Number of children: Not on file   Years of education: Not on file   Highest education level: Not on file  Occupational History   Not on file  Tobacco Use   Smoking status: Never   Smokeless tobacco: Never  Substance and Sexual Activity   Alcohol use: Yes   Drug use: No   Sexual activity: Not on file  Other Topics Concern   Not on file  Social History Narrative   Not on file   Social Determinants of Health   Financial Resource Strain: Not on file  Food Insecurity: Not on file  Transportation Needs: Not on file  Physical Activity: Not on file  Stress: Not on file  Social Connections: Not on file  Intimate Partner Violence: Not on file    Family History:    Family History  Problem Relation Age of Onset   Hypertension Mother    Hypertension Father      ROS:  Patient is sedated and on ventilator, not able to answer questions      Physical Exam/Data:   Vitals:   07/05/20 1000 07/05/20 1100 07/05/20 1104 07/05/20 1130  BP:      Pulse: 65 65    Resp: (!) 22 17    Temp:   98.8 F (37.1 C)   TempSrc:   Axillary   SpO2: 96% 97%  97%  Weight:      Height:        Intake/Output Summary (Last 24  hours) at 07/05/2020 1247 Last data filed at 07/05/2020 1100 Gross per 24 hour  Intake 697.43 ml  Output 85 ml  Net 612.43 ml   Last 3 Weights 07/05/2020  Weight (lbs) 182 lb 8.7 oz  Weight (kg) 82.8 kg     Body mass index is 27.76 kg/m.    Vitals:  Vitals:   07/05/20 1130 07/05/20 1200  BP:    Pulse:  76  Resp:  20  Temp:    SpO2: 97% 97%   General Appearance: In no apparent distress, laying in bed HEENT: Normocephalic, atraumatic. Pupils pinpoint bilaterally  Neck: Supple, trachea midline Cardiovascular: Regular rate and rhythm, normal S1-S2,  murmur + L/RSB Respiratory: on mechanical ventilator support, PRVC TV510, FIO2 30%, PEEP5, RR20; pox 97%. Lung sounds clear.  Gastrointestinal: Bowel sounds positive, abdomen soft, DHT in place with feeds infusing Extremities: no spontaneous movement of all extremity , hand mittens on  Genitourinary: Foley with small amount of urine  Musculoskeletal: Normal muscle bulk and tone Skin: Intact, warm, dry. Bruise noted of arms  Neurologic: Obtunded, not responding to external stimuli, non-verbal, neuro exam is limited by sedation at this time  Psychiatric: Unable to assess  Left IJ CVL with dressing intact   EKG:  The EKG was personally reviewed and demonstrates:  EKG revealed sinus rhythm, ventricular rate of 87 bpm, IVCD, nonspecific change  Telemetry:  Telemetry was personally reviewed and demonstrates:  Sinus rhythm with ventricular rate of 80s, PVCs occasionally, artifacts   Relevant CV Studies:  Echo is pending   Laboratory Data:  High Sensitivity Troponin:   Recent Labs  Lab 06/20/2020 2206 07/05/20 0314 07/05/20 0405  TROPONINIHS 375* 8,487* 6,803*     Chemistry Recent Labs  Lab 06/29/2020 2206 06/29/2020 2231 06/30/2020 2233 07/05/20 0255 07/05/20 0314 07/05/20 1127  NA 140 139   < > 141 142 143  K 3.8 3.3*   < > 2.8* 2.8* 2.9*  CL 105 107  --   --  111  --   CO2 13*  --   --   --  15*  --   GLUCOSE 315* 304*  --    --  199*  --   BUN 25* 29*  --   --  32*  --   CREATININE 1.97* 1.80*  --   --  2.18*  --   CALCIUM 11.0*  --   --   --  11.0*  --   GFRNONAA 28*  --   --   --  25*  --   ANIONGAP 22*  --   --   --  16*  --    < > = values in this interval not displayed.    Recent Labs  Lab 06/19/2020 2206  PROT 5.3*  ALBUMIN 3.1*  AST 351*  ALT 377*  ALKPHOS 68  BILITOT 0.6   Hematology Recent Labs  Lab 06/14/2020 2206 06/24/2020 2231 07/05/20 0255 07/05/20 0314 07/05/20 1127  WBC 19.3*  --   --  19.6*  --   RBC 3.94  --   --  4.87  --   HGB 12.3   < > 14.6 15.2* 13.6  HCT 38.9   < > 43.0 44.0 40.0  MCV 98.7  --   --  90.3  --   MCH 31.2  --   --  31.2  --   MCHC 31.6  --   --  34.5  --   RDW 12.9  --   --  12.8  --   PLT 210  --   --  237  --    < > = values in this interval not displayed.   BNPNo results for input(s): BNP, PROBNP in the last 168 hours.  DDimer No results for input(s): DDIMER in the last 168 hours.   Radiology/Studies:  CT Head Wo Contrast  Result Date: 06/26/2020 CLINICAL DATA:  Cardiac arrest.  Altered mental status EXAM: CT HEAD WITHOUT CONTRAST TECHNIQUE: Contiguous axial images were obtained from the base of the skull through the vertex without intravenous contrast. COMPARISON:  None. FINDINGS: Brain: No acute intracranial abnormality. Specifically, no hemorrhage, hydrocephalus, mass lesion, acute infarction, or significant intracranial injury. Vascular: No hyperdense vessel or unexpected calcification. Skull: No acute calvarial abnormality. Sinuses/Orbits: No acute findings Other: None IMPRESSION: No acute intracranial abnormality. Electronically Signed   By: Rolm Baptise M.D.   On: 06/15/2020 23:30   DG CHEST PORT 1 VIEW  Result Date: 07/05/2020 CLINICAL DATA:  Cardiac arrest. EXAM: PORTABLE CHEST 1 VIEW COMPARISON:  07/03/2020. FINDINGS: Endotracheal tube, NG tube in stable position. Heart size normal. Low lung volumes with mild bibasilar atelectasis/infiltrates.  Small left pleural effusion cannot be excluded. Left costophrenic angle incompletely imaged. No pneumothorax. IMPRESSION: 1.  Lines and tubes in stable position. 2. Low lung volumes with mild bibasilar atelectasis/infiltrates. Small left pleural effusion cannot be excluded. Electronically Signed   By: Marcello Moores  Register   On: 07/05/2020 05:32   DG Chest Port 1 View  Result Date: 07/05/2020 CLINICAL DATA:  Status post CPR for line placement. EXAM: PORTABLE CHEST 1 VIEW COMPARISON:  None. FINDINGS: Overlying radiopaque tags and cardiac lead wires are seen. An endotracheal tube is seen with its distal tip approximately 6.6 cm from the carina. An orogastric tube is noted with its distal end looped within the body of the stomach. The heart size and mediastinal contours are within normal limits. Both lungs are clear. The visualized skeletal structures are unremarkable. IMPRESSION: Endotracheal tube and orogastric tube positioning, as described above. Electronically Signed   By: Virgina Norfolk M.D.   On: 06/19/2020 22:22   EEG adult  Result Date: 07/05/2020 Hortense Ramal,  Cecil Cranker, MD     07/05/2020 11:02 AM Patient Name: Summer Sexton MRN: [id number] Epilepsy Attending: Lora Havens Referring Physician/Provider: Georgann Housekeeper, NP Date: 07/05/2020 Duration: 22.29 mins Patient history: 65 year old female status post cardiac arrest.  EEG to evaluate for seizures. Level of alertness:  comatose/sedated AEDs during EEG study: None Technical aspects: This EEG study was done with scalp electrodes positioned according to the 10-20 International system of electrode placement. Electrical activity was acquired at a sampling rate of _0  and reviewed with a high frequency filter of _1  and a low frequency filter of _2 . EEG data were recorded continuously and digitally stored. Description:EEG showed continuous generalized polymorphic 3 to 6 Hz theta-delta slowing.  EEG was reactive to tactile stimulation.  Hyperventilation and photic  stimulation were not performed.   ABNORMALITY - Continuous slow, generalized IMPRESSION: This study is suggestive of moderate to severe diffuse encephalopathy, nonspecific etiology. No seizures or epileptiform discharges were seen throughout the recording. Priyanka Barbra Sarks   US Abdomen Limited RUQ (LIVER/GB)  Result Date: 07/05/2020 CLINICAL DATA:  Transaminitis. EXAM: ULTRASOUND ABDOMEN LIMITED RIGHT UPPER QUADRANT COMPARISON:  CT 06/08/2009. FINDINGS: Gallbladder: No gallstones or wall thickening visualized. No sonographic Murphy sign noted by sonographer. Common bile duct: Diameter: 3.8 mm Liver: Left hepatic lobe difficult visualized. Increased hepatic echogenicity consistent fatty infiltration and or hepatocellular disease. Small area of decreased attenuation noted adjacent to the gallbladder fossa, most likely focal fatty sparing. Portal vein is patent on color Doppler imaging with normal direction of blood flow towards the liver. Other: None. IMPRESSION: 1.  No gallstones or biliary distention. 2. Left hepatic lobe poorly visualized. Increased hepatic echogenicity consistent with fatty infiltration or hepatocellular disease. Small area of decreased attenuation noted adjacent to the gallbladder fossa, most likely focal fatty sparing. Follow-up ultrasound in 3 months can be obtained to ensure stability of this area of probable focal fatty sparing. Electronically Signed   By: Marcello Moores  Register   On: 07/05/2020 06:40     Assessment and Plan:   Unwitnessed out of hospital cardiac arrest Ventricular fibrillation requiring multiple defibrillation by EMS NSTEMI -See H&P for history -Hs troponin 375 >8487 > 6803 >20678, ACS versus demand  -EKG unspecific -Echo pending  -Recommend cardiac cath for ischemic evaluation and AICD evaluation by EP if there is meaningful neurological recovery and renal recovery   Post arrest shock - Post cardiac arrest - AM cortisol WNL, lactic acid 6.4 >5.1 >5.9 ,  septic/DKA workup per PCCM  - Echo pending - Coox 77.7%, query sepsis, unlikely cardiogenic     - managed per PCCM   Hypokalemia -Mag 3.2, potassium 2.9, replaced by ICU, keep Mag >2 and K >4   Acute metabolic encephalopathy due to anoxia - prolonged CPR 40 min to 1 hour - EEG with generalized slowing, diffuse encephalopathy, no status - monitor neuro exam for improvement   AKI/ATN-oliguria  - duet to shock/cardiac arrest  - renal index continue rising - monitor UOP, appears oliguric, 52m over the past 24 hours - consider nephrology consult  - HOLD ARB/HCTZ, avoid nephrotoxin, trend BMP daily  - managed per PCCM   Ventilatory dependent respiratory failure Presumed aspiration  -Remains on mechanical ventilator support -Elevated procalcitonin, started on Unasyn for aspiration pneumonia prophylaxis -COVID and flu negative, blood culture NTD, MRSA and strep pna negative -Managed per PCCM  Hyperglycemia Anion gap metabolic acidosis Suspect DKA -Hemoglobin A1c pending, beta hydro mildly elevated, ABG improving  -Persistently hyperglycemic with glucose above 200 -Started on  insulin regimen per primary team, glycemic goal 140-180 - managed per PCCM   HTN - BP requiring vasopressor support at this time - hold home meds olmesartan/hydrochlorothiazide  Hypothyroidism -TSH suppressed T4 WNL - On levothyroxine - managed per PCCM   Ischemic hepatitis - consider trend LFTs and checking INR   Hx of DIC in 1960s - during child birth      Risk Assessment/Risk Scores:  [id number]}           For questions or updates, please contact [place] Please consult [email or web address] for contact info under    Signed, Margie Billet, NP  07/05/2020 12:47 PM  Patient seen, examined. Available data reviewed. Agree with findings, assessment, and plan as outlined by Margie Billet, NP.  The patient is independently interviewed and examined.  Her husband and daughter are at the bedside.  The  patient is intubated and sedated, unresponsive.  HEENT is negative except for some facial ecchymoses.  Neck: JVP difficult to visualize, carotid upstrokes normal without bruits, heart is distant and regular rate and rhythm with no murmur, lungs are clear, abdomen is soft and nontender, extremities have no edema, skin has scattered ecchymoses with no hematoma.  The patient's 2D echocardiogram is reviewed with findings below:  IMPRESSIONS     1. Left ventricular ejection fraction, by estimation, is 55%. The left  ventricle has normal function. The left ventricle has no regional wall  motion abnormalities. There is mild eccentric left ventricular hypertrophy  of the septal segment. Left  ventricular diastolic parameters are consistent with Grade I diastolic  dysfunction (impaired relaxation).   2. Right ventricular systolic function is mildly reduced. The right  ventricular size is normal. Tricuspid regurgitation signal is inadequate  for assessing PA pressure.   3. The mitral valve is normal in structure. Trivial mitral valve  regurgitation. No evidence of mitral stenosis.   4. The aortic valve was not well visualized. Aortic valve regurgitation  is not visualized. No aortic stenosis is present.   5. The inferior vena cava is normal in size with greater than 50%  respiratory variability, suggesting right atrial pressure of 3 mmHg.   Labs show hyponatremia with a potassium of 2.9, creatinine of 3.1, troponin of 20,000, mixed venous oxygen saturation 78%. HgB normal at 13.6. Procalcitonin 12.99.  Mixed clinical picture with OOH cardiac arrest and prolonged CPR/resuscitation with multiple shocks for VF. Clearly has suffered a cardiac event with VF arrest, trop 20,000. No STE on EKG. Also with high mixed venous O2 and procalcitonin > 10 suggestive of sepsis. Empiric abx per CCM team. Discussed with family and plan for continued supportive care to observe for neurologic recovery. Fortunately LVEF  is preserved without regional wall motion abnormalities. Would start IV heparin to treat ACS since such significant troponin increase. Continue ASA. Wean epi as tolerated.  Will follow with you.   Sherren Mocha, M.D. 07/05/2020 5:14 PM

## 2020-07-05 NOTE — Assessment & Plan Note (Signed)
In the setting of cardiac arrest and shock.  Also has mild elevation of beta hydroxybutyric acid which suggest mild component of DKA however lactate primary driving issue in her anion gap metabolic acidosis Plan Continue supportive care Treating shock Repeat arterial blood gas later today Repeat lactate and beta hydroxybutyric acid later today

## 2020-07-05 NOTE — Assessment & Plan Note (Signed)
Plan TSH pending Continue Synthroid via tube

## 2020-07-05 NOTE — Progress Notes (Signed)
eLink Physician-Brief Progress Note Patient Name: Nekeshia Lenhardt DOB: [birth year] MRN: [id number]   Date of Service  07/05/2020  HPI/Events of Note  Bedside RN requesting order for soft restraint due to agitation, also requesting  Flexiseal order for diarrhea.  eICU Interventions  Bilateral soft wrist restraints ordered, Flexiseal ordered.        Thomasene Lot Tekela Garguilo 07/05/2020, 4:16 AM

## 2020-07-05 NOTE — Assessment & Plan Note (Addendum)
Post cardiac arrest cardiogenic shock. Remains on epinephrine gtt. Has elevated PCT so can't exclude element of sepsis. Also ? Drug effect on hypotension from precedex Plan Cont epi gtt, SBP goal >90, MAP > 65 Central access; get CVP: goal euvolemic  Repeat lactate  Hold anithypertensives Ck cortisol Initiate empiric antibiotics

## 2020-07-05 NOTE — Assessment & Plan Note (Signed)
Significance unclear Plan Follow-up ammonia in a.m. Add lactulose

## 2020-07-05 NOTE — H&P (Signed)
NAME:  Summer Sexton, MRN:  [id number], DOB:  03/19/1955, LOS: 1 ADMISSION DATE:  06/25/2020, CONSULTATION DATE:  6/29 REFERRING MD:  Dr. Madilyn Hook, CHIEF COMPLAINT:  post cardiac arrest   History of Present Illness:  Patient is a 65 yo F w/ pertinent PMH of hypothyroidism, HLD, HTN w/ kidney disease, and murmur presents to [facility] on 6/29 w/ cardiac arrest.  Patient was found down by husband and he started CPR. Firefighters arrived on scene and continued CPR. EMS arrived and continued CPR, placed ETT, and transported patient to [facility]. Total CPR time about 1 hour until ROSC. Purposeful movement noticed by EMS post ROSC. She received multiple defibrillations for vfib rhythm and was treated with 9 epi, 450 amio, 2 g mag, 2 g bicarb, 2.5 Versed, 100 lidocaine, 50 fentanyl, 6 g epi drip.   PCCM consulted for icu admission and medical management.  Pertinent  Medical History   Past Medical History:  Diagnosis Date   Arthritis    Fibromyalgia    Hypertension    Kidney disease    Liver disease    Murmur    Right bundle branch block      Significant Hospital Events: Including procedures, antibiotic start and stop dates in addition to other pertinent events   6/29: admitted to [facility]; post cardiac arrest; 1 hour ROSC; vfib rhythm; multiple shocks; epi, bicarb, calcium, amio, mag, and lidocaine given. Intubated on epi drip. Non responsive to painful stimuli.  Interim History / Subjective:  Patient currently intubated on 2 of epi drip. Hem stable Non responsive to painful stimuli  Objective   Blood pressure 107/85, pulse 80, temperature 98.5 F (36.9 C), temperature source Oral, resp. rate (!) 21, height 5\' 8"  (1.727 m), SpO2 99 %.    Vent Mode: PRVC FiO2 (%):  [70 %-100 %] 70 % Set Rate:  [20 bmp] 20 bmp Vt Set:  [510 mL] 510 mL PEEP:  [5 cmH20] 5 cmH20 Plateau Pressure:  [18 cmH20-22 cmH20] 22 cmH20  No intake or output data in the 24 hours ending 07/05/20 0121 There were no vitals filed for this  visit.  Examination: General:  critically ill appearing intubated patient HEENT: MM pink/moist; ETT in place Neuro: non responsive to painful stimuli; Pupils equal 6 mm and minimally reactive to light. Horizontal roving eye movements. Non purposeful flexion. Cough gag reflex in tact. Breathing spontaneously over vent. CV: s1s2, RRR, no m/r/g PULM:  dim clear bs bilaterally; ETT in place on mech vent GI: soft, bsx4 active  Extremities: warm/dry, no edema  Skin: no rashes or lesions  CT head negative EKG: NSR  Resolved Hospital Problem list     Assessment & Plan:  S/p Post cardiac arrest: Unknown cause; ROSC 1 hour; CT head negative; EEG pending Cardiogenic shock (Improving) P: -EEG and Echo ordered -Admit to ICU for continuous telemetry monitoring -Wean Epi for MAP >65 -limit sedation for neuro exams -Trend electrolytes, troponin, lactate -UDS sent  Acute respiratory failure w/ hypoxia P: -Continue full mech vent support: PRVC -VAP prevention protocol -ABG and CXR am -Wean fio2 for sats >92% -Daily SBTs  AKI Anion gap metabolic acidosis P: -avoid nephrotoxic agents/renal dose meds -trend lactic acid; beta-hydrox ordered  Hypokalemia P: -will replete K -trend BMP and mag  Elevated LFTs: likely shock liver P: -trend LFTs  Leukocytosis P: -BC x2 pending -trend CBC and fever  Hyperglycemia P: -SSI and CBG monitoring -a1c and beta-hydrox sent  Hypothyroidism P: -Check TSH -Will start home synthroid  HLD, HTN P: -will restart home statin -hold home BP meds  Best Practice (right click and "Reselect all SmartList Selections" daily)   Diet/type: NPO DVT prophylaxis: prophylactic heparin  GI prophylaxis: PPI Lines: N/A Foley:  Yes, and it is still needed Code Status:  full code Last date of multidisciplinary goals of care discussion [6/29: husband and son updated at bedside]  Labs   CBC: Recent Labs  Lab 07-23-2020 2206 2020-07-23 2231  2020/07/23 2233  WBC 19.3*  --   --   NEUTROABS 10.2*  --   --   HGB 12.3 12.6 12.2  HCT 38.9 37.0 36.0  MCV 98.7  --   --   PLT 210  --   --     Basic Metabolic Panel: Recent Labs  Lab 07/23/20 2206 Jul 23, 2020 2231 2020-07-23 2233  NA 140 139 139  K 3.8 3.3* 3.4*  CL 105 107  --   CO2 13*  --   --   GLUCOSE 315* 304*  --   BUN 25* 29*  --   CREATININE 1.97* 1.80*  --   CALCIUM 11.0*  --   --    GFR: CrCl cannot be calculated (Unknown ideal weight.). Recent Labs  Lab Jul 23, 2020 2206  WBC 19.3*    Liver Function Tests: Recent Labs  Lab 07-23-2020 2206  AST 351*  ALT 377*  ALKPHOS 68  BILITOT 0.6  PROT 5.3*  ALBUMIN 3.1*   No results for input(s): LIPASE, AMYLASE in the last 168 hours. No results for input(s): AMMONIA in the last 168 hours.  ABG    Component Value Date/Time   HCO3 12.8 (L) 07/23/2020 2233   TCO2 14 (L) Jul 23, 2020 2233   ACIDBASEDEF 14.0 (H) Jul 23, 2020 2233   O2SAT 88.0 Jul 23, 2020 2233     Coagulation Profile: No results for input(s): INR, PROTIME in the last 168 hours.  Cardiac Enzymes: No results for input(s): CKTOTAL, CKMB, CKMBINDEX, TROPONINI in the last 168 hours.  HbA1C: No results found for: HGBA1C  CBG: Recent Labs  Lab 07/05/20 0048  GLUCAP 225*    Review of Systems:   Unable to obtain from patient. Obtained info from chart and nurse  Past Medical History:  She,  has a past medical history of Arthritis, Fibromyalgia, Hypertension, Kidney disease, Liver disease, Murmur, and Right bundle branch block.   Surgical History:   Past Surgical History:  Procedure Laterality Date   CESAREAN SECTION     KNEE SURGERY     PARTIAL HYSTERECTOMY       Social History:   reports that she has never smoked. She has never used smokeless tobacco. She reports current alcohol use. She reports that she does not use drugs.   Family History:  Her family history includes Hypertension in her father and mother.   Allergies No Known  Allergies   Home Medications  Prior to Admission medications   Medication Sig Start Date End Date Taking? Authorizing Provider  acetaminophen (TYLENOL) 500 MG tablet Take 500-1,000 mg by mouth every 6 (six) hours as needed for moderate pain.   Yes [provider]  cetirizine (ZYRTEC) 10 MG tablet Take 10 mg by mouth daily as needed for allergies.   Yes [provider]  DULoxetine (CYMBALTA) 60 MG capsule Take 60 mg by mouth daily. 04/10/20  Yes [provider]  ibuprofen (ADVIL,MOTRIN) 200 MG tablet Take 200 mg by mouth every 6 (six) hours as needed for headache or mild pain.   Yes [provider]  levothyroxine (SYNTHROID) 100 MCG tablet Take 100 mcg by mouth daily. 04/10/20  Yes [provider]  olmesartan-hydrochlorothiazide (BENICAR HCT) 40-25 MG tablet Take 1 tablet by mouth daily. 06/23/20  Yes [provider]  rosuvastatin (CRESTOR) 5 MG tablet Take 5 mg by mouth at bedtime. 04/10/20  Yes [provider]  benzonatate (TESSALON) 100 MG capsule Take 1-2 capsules (100-200 mg total) by mouth 3 (three) times daily as needed for cough. Patient not taking: No sig reported 03/23/17   Wallis Bamberg, PA-C  traMADol (ULTRAM) 50 MG tablet Take 1 tablet (50 mg total) by mouth every 6 (six) hours as needed. Patient not taking: Reported on 07/05/2020 08/26/15   Alene Mires, NP     Critical care time: 69    JD Anselm Lis [facility] 07/05/2020, 1:21 AM  Please see [email or web address] for pager details.  From 7A-7P if no response, please call [phone]. After hours, please call ELink [phone].

## 2020-07-05 NOTE — Progress Notes (Signed)
eLink Physician-Brief Progress Note Patient Name: Summer Sexton DOB: Jul 13, 1955 MRN: [id number]   Date of Service  07/05/2020  HPI/Events of Note  Ammonia level 139, Lactic acid  5.1 (improved from prior baseline), Troponin 6803 (improved from prior baseline).  eICU Interventions  Lactulose 30 gm via NG tube tid ordered.        Thomasene Lot Joseandres Mazer 07/05/2020, 5:46 AM

## 2020-07-05 NOTE — Assessment & Plan Note (Signed)
No noted history of diabetes Plan CBGs every 4 Sliding scale insulin Follow-up A1c

## 2020-07-05 NOTE — Progress Notes (Addendum)
eLink Physician-Brief Progress Note Patient Name: Summer Sexton DOB: 1955-06-08 MRN: [id number]   Date of Service  07/05/2020  HPI/Events of Note  Patient is s/p out of hospital cardiac arrest, she is intubated and on the ventilator, EMS documented purposeful activity, patient is awake and looking around / flailing around, but will not follow instructions or respond to simple questions. She is at risk of self-extubation from her degree of agitatio. Per bedside RN she is having some ectopy on the monitor, her last recorded K+ was 3.4. Bedside RN reports pin point pupils.  eICU Interventions  New Patient Evaluation. Precedex + Fentanyl ordered for sedation, K+ replacement ordered, and BMP + Mg++  labs ordered. CT scan of the brain done ealrier was unremarkable for an intracranial process.        Danne Vasek U Mosiah Bastin 07/05/2020, 2:10 AM

## 2020-07-05 NOTE — Assessment & Plan Note (Signed)
Plan Cont cymbalta

## 2020-07-05 NOTE — Assessment & Plan Note (Addendum)
Following cardiac arrest.  Downtime with CPR estimated at 43 minutes however was apparently propofol post arrest CT brain normal Plan Supportive care PAD protocol RASS goal 0 to -1 Avoid fever Serial neuro checks Lactulose addressed below Follow-up EEG

## 2020-07-05 NOTE — Progress Notes (Signed)
PCCM Brief Progress Note:  BP responding to gentle fluids. TTE reviewed, mild EF reduction to my eye, no formal read. SVO2 central line 77%. Less convincing for cardiogenic shock alone. Continue fluid bolus. If tolerates will order 2nd liter. Wean Epi, MAP > 65. Agitated, opening eyes. Try higher dose versed PRN, consider versed drip. Sedation does drop BP arguing for element of hypovolemia.

## 2020-07-05 NOTE — Progress Notes (Signed)
Discussed with Dr. Marchelle Gearing. MAP goal changed to >85.  JD Anselm Lis [facility] 07/05/2020, 2:52 AM  Please see [email or web address] for pager details.  From 7A-7P if no response, please call [phone]. After hours, please call ELink [phone].

## 2020-07-05 NOTE — Assessment & Plan Note (Addendum)
Out of  Hospital cardiac arrest. Has h/o HTN and cardiac murmur, trop starting to trend down.  Plan ECHO Tele  [facility] Cycle CEs Cards consult

## 2020-07-05 NOTE — Progress Notes (Signed)
Pharmacy Antibiotic Note  Summer Sexton is a 65 y.o. female admitted on 06/16/2020 with aspiration pneumonia.  Pharmacy has been consulted for Unasyn dosing. Rising Scr currently 2.18, CrCl 29  Plan: Unasyn 1.5 gm IV q12hr Monitor renal function, clinical status and C&S  Height: 5\' 8"  (172.7 cm) Weight: 82.8 kg (182 lb 8.7 oz) IBW/kg (Calculated) : 63.9  Temp (24hrs), Avg:96.7 F (35.9 C), Min:95.4 F (35.2 C), Max:98.5 F (36.9 C)  Recent Labs  Lab 07/05/2020 2206 06/08/2020 2231 07/05/20 0307 07/05/20 0314 07/05/20 0321  WBC 19.3*  --   --  19.6*  --   CREATININE 1.97* 1.80*  --  2.18*  --   LATICACIDVEN  --   --  6.4*  --  5.1*    Estimated Creatinine Clearance: 29.8 mL/min (A) (by C-G formula based on SCr of 2.18 mg/dL (H)).    No Known Allergies  Antimicrobials this admission: Unasyn 6/29 >>   Thank you for allowing pharmacy to be a part of this patient's care.  7/29, PharmD, [facility] Main Clinical Pharmacist Please see AMION for all Pharmacists' Contact Phone Numbers 07/05/2020, 9:22 AM

## 2020-07-05 NOTE — Procedures (Signed)
Patient Name: Summer Sexton  MRN: [id number]  Epilepsy Attending: Charlsie Quest  Referring Physician/Provider: Joneen Roach, NP Date: 07/05/2020 Duration: 22.29 mins  Patient history: 65 year old female status post cardiac arrest.  EEG to evaluate for seizures.  Level of alertness:  comatose/sedated  AEDs during EEG study: None  Technical aspects: This EEG study was done with scalp electrodes positioned according to the 10-20 International system of electrode placement. Electrical activity was acquired at a sampling rate of 500Hz  and reviewed with a high frequency filter of 70Hz  and a low frequency filter of 1Hz . EEG data were recorded continuously and digitally stored.   Description:EEG showed continuous generalized polymorphic 3 to 6 Hz theta-delta slowing.  EEG was reactive to tactile stimulation.  Hyperventilation and photic stimulation were not performed.     ABNORMALITY - Continuous slow, generalized  IMPRESSION: This study is suggestive of moderate to severe diffuse encephalopathy, nonspecific etiology. No seizures or epileptiform discharges were seen throughout the recording.  [name] 

## 2020-07-06 ENCOUNTER — Inpatient Hospital Stay (HOSPITAL_COMMUNITY): Payer: 59

## 2020-07-06 DIAGNOSIS — Z515 Encounter for palliative care: Secondary | ICD-10-CM

## 2020-07-06 DIAGNOSIS — Z66 Do not resuscitate: Secondary | ICD-10-CM

## 2020-07-06 DIAGNOSIS — N182 Chronic kidney disease, stage 2 (mild): Secondary | ICD-10-CM

## 2020-07-06 DIAGNOSIS — N179 Acute kidney failure, unspecified: Secondary | ICD-10-CM

## 2020-07-06 LAB — COMPREHENSIVE METABOLIC PANEL
ALT: 240 U/L — ABNORMAL HIGH (ref 0–44)
AST: 307 U/L — ABNORMAL HIGH (ref 15–41)
Albumin: 2.8 g/dL — ABNORMAL LOW (ref 3.5–5.0)
Alkaline Phosphatase: 44 U/L (ref 38–126)
Anion gap: 11 (ref 5–15)
BUN: 57 mg/dL — ABNORMAL HIGH (ref 8–23)
CO2: 14 mmol/L — ABNORMAL LOW (ref 22–32)
Calcium: 8.6 mg/dL — ABNORMAL LOW (ref 8.9–10.3)
Chloride: 115 mmol/L — ABNORMAL HIGH (ref 98–111)
Creatinine, Ser: 5.06 mg/dL — ABNORMAL HIGH (ref 0.44–1.00)
GFR, Estimated: 9 mL/min — ABNORMAL LOW (ref >60)
Glucose, Bld: 127 mg/dL — ABNORMAL HIGH (ref 70–99)
Potassium: 4.9 mmol/L (ref 3.5–5.1)
Sodium: 140 mmol/L (ref 135–145)
Total Bilirubin: 0.9 mg/dL (ref 0.3–1.2)
Total Protein: 5.5 g/dL — ABNORMAL LOW (ref 6.5–8.1)

## 2020-07-06 LAB — GLUCOSE, CAPILLARY
Glucose-Capillary: 111 mg/dL — ABNORMAL HIGH (ref 70–99)
Glucose-Capillary: 51 mg/dL — ABNORMAL LOW (ref 70–99)
Glucose-Capillary: 79 mg/dL (ref 70–99)

## 2020-07-06 LAB — POCT I-STAT 7, (LYTES, BLD GAS, ICA,H+H)
Acid-base deficit: 14 mmol/L — ABNORMAL HIGH (ref 0.0–2.0)
Bicarbonate: 13.3 mmol/L — ABNORMAL LOW (ref 20.0–28.0)
Calcium, Ion: 1.22 mmol/L (ref 1.15–1.40)
HCT: 40 % (ref 36.0–46.0)
Hemoglobin: 13.6 g/dL (ref 12.0–15.0)
O2 Saturation: 88 %
Patient temperature: 39.9
Potassium: 5.8 mmol/L — ABNORMAL HIGH (ref 3.5–5.1)
Sodium: 139 mmol/L (ref 135–145)
TCO2: 14 mmol/L — ABNORMAL LOW (ref 22–32)
pCO2 arterial: 38.9 mmHg (ref 32.0–48.0)
pH, Arterial: 7.159 — CL (ref 7.350–7.450)
pO2, Arterial: 80 mmHg — ABNORMAL LOW (ref 83.0–108.0)

## 2020-07-06 LAB — URINE CULTURE: Culture: NO GROWTH

## 2020-07-06 LAB — PATHOLOGIST SMEAR REVIEW

## 2020-07-06 LAB — CBC
HCT: 43.9 % (ref 36.0–46.0)
Hemoglobin: 14.4 g/dL (ref 12.0–15.0)
MCH: 30.6 pg (ref 26.0–34.0)
MCHC: 32.8 g/dL (ref 30.0–36.0)
MCV: 93.4 fL (ref 80.0–100.0)
Platelets: 181 10*3/uL (ref 150–400)
RBC: 4.7 MIL/uL (ref 3.87–5.11)
RDW: 13.8 % (ref 11.5–15.5)
WBC: 3.8 10*3/uL — ABNORMAL LOW (ref 4.0–10.5)
nRBC: 0.8 % — ABNORMAL HIGH (ref 0.0–0.2)

## 2020-07-06 LAB — MAGNESIUM: Magnesium: 2.8 mg/dL — ABNORMAL HIGH (ref 1.7–2.4)

## 2020-07-06 LAB — HEMOGLOBIN A1C
Hgb A1c MFr Bld: 5.5 % (ref 4.8–5.6)
Mean Plasma Glucose: 111 mg/dL

## 2020-07-06 LAB — LEGIONELLA PNEUMOPHILA SEROGP 1 UR AG: L. pneumophila Serogp 1 Ur Ag: NEGATIVE

## 2020-07-06 LAB — LACTIC ACID, PLASMA: Lactic Acid, Venous: 5.2 mmol/L (ref 0.5–1.9)

## 2020-07-06 LAB — HEPARIN LEVEL (UNFRACTIONATED): Heparin Unfractionated: 0.36 IU/mL (ref 0.30–0.70)

## 2020-07-06 LAB — PHOSPHORUS: Phosphorus: 3.9 mg/dL (ref 2.5–4.6)

## 2020-07-06 LAB — PROCALCITONIN: Procalcitonin: 53.73 ng/mL

## 2020-07-06 MED ORDER — POLYVINYL ALCOHOL 1.4 % OP SOLN
1.0000 [drp] | Freq: Four times a day (QID) | OPHTHALMIC | Status: DC | PRN
  Filled 2020-07-06: qty 15

## 2020-07-06 MED ORDER — GLYCOPYRROLATE 1 MG PO TABS: 1.0000 mg | ORAL_TABLET | ORAL | Status: DC | PRN

## 2020-07-06 MED ORDER — SODIUM BICARBONATE 8.4 % IV SOLN
50.0000 meq | Freq: Once | INTRAVENOUS | Status: AC
  Administered 2020-07-06: 50 meq via INTRAVENOUS
  Filled 2020-07-06: qty 50

## 2020-07-06 MED ORDER — NOREPINEPHRINE 4 MG/250ML-% IV SOLN
INTRAVENOUS | Status: AC
  Administered 2020-07-06: 10 ug/min via INTRAVENOUS
  Filled 2020-07-06: qty 250

## 2020-07-06 MED ORDER — PIPERACILLIN-TAZOBACTAM IN DEX 2-0.25 GM/50ML IV SOLN
2.2500 g | Freq: Three times a day (TID) | INTRAVENOUS | Status: DC
  Filled 2020-07-06 (×2): qty 50

## 2020-07-06 MED ORDER — DIPHENHYDRAMINE HCL 50 MG/ML IJ SOLN: 25.0000 mg | INTRAMUSCULAR | Status: DC | PRN

## 2020-07-06 MED ORDER — ACETAMINOPHEN 325 MG PO TABS: 650.0000 mg | ORAL_TABLET | Freq: Four times a day (QID) | ORAL | Status: DC | PRN

## 2020-07-06 MED ORDER — DEXTROSE 5 % IV SOLN: INTRAVENOUS | Status: DC

## 2020-07-06 MED ORDER — SODIUM BICARBONATE 8.4 % IV SOLN: 100.0000 meq | Freq: Once | INTRAVENOUS | Status: AC

## 2020-07-06 MED ORDER — NOREPINEPHRINE 4 MG/250ML-% IV SOLN
0.0000 ug/min | INTRAVENOUS | Status: DC
  Filled 2020-07-06: qty 250

## 2020-07-06 MED ORDER — SODIUM CHLORIDE 0.9 % IV SOLN
1.5000 g | INTRAVENOUS | Status: DC
  Filled 2020-07-06: qty 4

## 2020-07-06 MED ORDER — SODIUM CHLORIDE 0.9% FLUSH: 10.0000 mL | INTRAVENOUS | Status: DC | PRN

## 2020-07-06 MED ORDER — ACETAMINOPHEN 650 MG RE SUPP: 650.0000 mg | Freq: Four times a day (QID) | RECTAL | Status: DC | PRN

## 2020-07-06 MED ORDER — ALBUMIN HUMAN 5 % IV SOLN
25.0000 g | Freq: Once | INTRAVENOUS | Status: AC
  Administered 2020-07-06: 25 g via INTRAVENOUS
  Filled 2020-07-06: qty 500

## 2020-07-06 MED ORDER — SODIUM BICARBONATE 8.4 % IV SOLN
INTRAVENOUS | Status: DC
  Filled 2020-07-06: qty 1000

## 2020-07-06 MED ORDER — GLYCOPYRROLATE 0.2 MG/ML IJ SOLN
0.2000 mg | INTRAMUSCULAR | Status: DC | PRN
  Administered 2020-07-06: 0.2 mg via INTRAVENOUS
  Filled 2020-07-06: qty 1

## 2020-07-06 MED ORDER — SODIUM BICARBONATE 8.4 % IV SOLN
INTRAVENOUS | Status: AC
  Administered 2020-07-06: 100 meq via INTRAVENOUS
  Filled 2020-07-06: qty 100

## 2020-07-06 MED ORDER — GLYCOPYRROLATE 0.2 MG/ML IJ SOLN: 0.2000 mg | INTRAMUSCULAR | Status: DC | PRN

## 2020-07-06 MED ORDER — SODIUM CHLORIDE 0.9% FLUSH: 10.0000 mL | Freq: Two times a day (BID) | INTRAVENOUS | Status: DC

## 2020-07-07 LAB — CULTURE, RESPIRATORY W GRAM STAIN: Culture: NORMAL

## 2020-07-07 NOTE — Progress Notes (Signed)
eLink Physician-Brief Progress Note Patient Name: Summer Sexton DOB: [birth year] MRN: [id number]   Date of Service  07/19/20  HPI/Events of Note  Patient with marked hypotension.  eICU Interventions  Sodium bicarbonate 2 amps iv x 1, Albumin 5 %  500 ml iv bolus, Norepinephrine gtt, stat CT abdomen and pelvis ordered to r/o mesenteric ischemia, Dr. Judeth Horn updated.        Thomasene Lot Gary Gabrielsen 2020/07/19, 6:52 AM

## 2020-07-07 NOTE — Progress Notes (Addendum)
Progress Note  Patient Name: Summer Sexton Date of Encounter: 06/11/2020  [facility] Cardiologist: New to [facility]   Subjective   Patient remains intubated and sedated today, numerous family members are at bedside, husband states they have discussed with critical care team, decision was made to transition the patient to comfort care. They are planning with terminal weaning of ventilator. He is grateful for the services received here and offers no further questions at this time.   Inpatient Medications    Scheduled Meds:  Chlorhexidine Gluconate Cloth  6 each Topical Daily   fentaNYL (SUBLIMAZE) injection  50 mcg Intravenous Once   mouth rinse  15 mL Mouth Rinse 10 times per day   sodium chloride flush  10-40 mL Intracatheter Q12H   Continuous Infusions:  sodium chloride Stopped (07/01/2020 0651)   dextrose     epinephrine 18 mcg/min (06/11/2020 0900)   fentaNYL infusion INTRAVENOUS 200 mcg/hr (07/01/2020 0900)   norepinephrine (LEVOPHED) Adult infusion 20 mcg/min (06/18/2020 0900)   PRN Meds: sodium chloride, acetaminophen **OR** acetaminophen, diphenhydrAMINE, fentaNYL, glycopyrrolate **OR** glycopyrrolate **OR** glycopyrrolate, midazolam, polyvinyl alcohol, sodium chloride flush   Vital Signs    Vitals:   06/16/2020 0835 07/01/2020 0845 07/04/2020 0900 07/05/2020 0915  BP:      Pulse:  87 91 95  Resp:  (!) 28 (!) 28 (!) 28  Temp:  (!) 97.5 F (36.4 C) (!) 97.3 F (36.3 C) (!) 97.2 F (36.2 C)  TempSrc:      SpO2: 93% 91% 99% 100%  Weight:      Height:        Intake/Output Summary (Last 24 hours) at 06/30/2020 1046 Last data filed at 06/20/2020 0900 Gross per 24 hour  Intake 5789.57 ml  Output 295 ml  Net 5494.57 ml   Last 3 Weights 06/28/2020 07/05/2020  Weight (lbs) 182 lb 15.7 oz 182 lb 8.7 oz  Weight (kg) 83 kg 82.8 kg      Telemetry    Sinus rhythm 80s, intermittent ventricular bigeminy - Personally Reviewed  ECG    N/A  - Personally Reviewed  Physical Exam   No  acute distress. Obtunded in bed. On ventilator support. No spontaneous movement. Rectal tube with liquid stool. Foley with no UOP.  Not responding to external stimuli. No further exam performed as there is numerous family members in the room grieving.    Labs    High Sensitivity Troponin:   Recent Labs  Lab 20-Jul-2020 2206 07/05/20 0314 07/05/20 0405 07/05/20 1112  TROPONINIHS 375* 8,487* 6,803* 20,678*      Chemistry Recent Labs  Lab 07/20/2020 2206 2020/07/20 2231 07/05/20 1112 07/05/20 1127 07/05/20 2205 06/22/2020 0401 06/13/2020 0413  NA 140   < > 142   < > 139 139 140  K 3.8   < > 2.9*   < > 4.4 5.8* 4.9  CL 105   < > 111  --  113*  --  115*  CO2 13*   < > 15*  --  16*  --  14*  GLUCOSE 315*   < > 238*  --  159*  --  127*  BUN 25*   < > 42*  --  52*  --  57*  CREATININE 1.97*   < > 3.13*  --  3.82*  --  5.06*  CALCIUM 11.0*   < > 9.8  --  8.8*  --  8.6*  PROT 5.3*  --   --   --   --   --  5.5*  ALBUMIN 3.1*  --   --   --   --   --  2.8*  AST 351*  --   --   --   --   --  307*  ALT 377*  --   --   --   --   --  240*  ALKPHOS 68  --   --   --   --   --  44  BILITOT 0.6  --   --   --   --   --  0.9  GFRNONAA 28*   < > 16*  --  13*  --  9*  ANIONGAP 22*   < > 16*  --  10  --  11   < > = values in this interval not displayed.     Hematology Recent Labs  Lab 06/07/2020 2206 06/13/2020 2231 07/05/20 0314 07/05/20 1127 2020-07-26 0401 07-26-2020 0443  WBC 19.3*  --  19.6*  --   --  3.8*  RBC 3.94  --  4.87  --   --  4.70  HGB 12.3   < > 15.2* 13.6 13.6 14.4  HCT 38.9   < > 44.0 40.0 40.0 43.9  MCV 98.7  --  90.3  --   --  93.4  MCH 31.2  --  31.2  --   --  30.6  MCHC 31.6  --  34.5  --   --  32.8  RDW 12.9  --  12.8  --   --  13.8  PLT 210  --  237  --   --  181   < > = values in this interval not displayed.    BNPNo results for input(s): BNP, PROBNP in the last 168 hours.   DDimer No results for input(s): DDIMER in the last 168 hours.   Radiology    DG Abd 1  View  Result Date: 2020-07-26 CLINICAL DATA:  Aspiration, von EXAM: ABDOMEN - 1 VIEW COMPARISON:  07/05/2020 FINDINGS: Feeding tube is in place with the tip in the distal stomach. There is gaseous distention of the stomach. Dilated small bowel loops in the abdomen and pelvis. Cannot exclude small bowel obstruction. No free air organomegaly. IMPRESSION: Gaseous distention of the stomach and small bowel concerning for small bowel obstruction. Feeding tube tip in the distal stomach. Electronically Signed   By: Charlett Nose M.D.   On: July 26, 2020 03:56   CT Head Wo Contrast  Result Date: 06/10/2020 CLINICAL DATA:  Cardiac arrest.  Altered mental status EXAM: CT HEAD WITHOUT CONTRAST TECHNIQUE: Contiguous axial images were obtained from the base of the skull through the vertex without intravenous contrast. COMPARISON:  None. FINDINGS: Brain: No acute intracranial abnormality. Specifically, no hemorrhage, hydrocephalus, mass lesion, acute infarction, or significant intracranial injury. Vascular: No hyperdense vessel or unexpected calcification. Skull: No acute calvarial abnormality. Sinuses/Orbits: No acute findings Other: None IMPRESSION: No acute intracranial abnormality. Electronically Signed   By: Charlett Nose M.D.   On: 06/07/2020 23:30   DG Chest Port 1 View  Result Date: 07-26-2020 CLINICAL DATA:  Aspiration, vomiting EXAM: PORTABLE CHEST 1 VIEW COMPARISON:  07/05/2020 FINDINGS: Cardiomegaly. Low lung volumes. Increasing bibasilar opacities, likely atelectasis. Left central line, endotracheal tube and feeding tube remain in place, unchanged. Gaseous distention of the stomach. IMPRESSION: Worsening aeration with low lung volumes and bibasilar atelectasis. Support devices stable. Gaseous distention of the stomach. Electronically Signed   By: Charlett Nose M.D.   On:  July 25, 2020 03:55   DG Chest Port 1 View  Result Date: 07/05/2020 CLINICAL DATA:  Status post central line placement EXAM: PORTABLE CHEST 1  VIEW COMPARISON:  Film from earlier in the same day. FINDINGS: Endotracheal tube and gastric catheter are again seen and stable. Left jugular central line is noted with the catheter tip in the distal superior vena cava. No pneumothorax is noted. Cardiac shadow is stable. Lungs are clear bilaterally. IMPRESSION: Left jugular central line in satisfactory position. No pneumothorax is noted. Electronically Signed   By: Alcide Clever M.D.   On: 07/05/2020 13:23   DG CHEST PORT 1 VIEW  Result Date: 07/05/2020 CLINICAL DATA:  Cardiac arrest. EXAM: PORTABLE CHEST 1 VIEW COMPARISON:  06/12/2020. FINDINGS: Endotracheal tube, NG tube in stable position. Heart size normal. Low lung volumes with mild bibasilar atelectasis/infiltrates. Small left pleural effusion cannot be excluded. Left costophrenic angle incompletely imaged. No pneumothorax. IMPRESSION: 1.  Lines and tubes in stable position. 2. Low lung volumes with mild bibasilar atelectasis/infiltrates. Small left pleural effusion cannot be excluded. Electronically Signed   By: Maisie Fus  Register   On: 07/05/2020 05:32   DG Chest Port 1 View  Result Date: 06/28/2020 CLINICAL DATA:  Status post CPR for line placement. EXAM: PORTABLE CHEST 1 VIEW COMPARISON:  None. FINDINGS: Overlying radiopaque tags and cardiac lead wires are seen. An endotracheal tube is seen with its distal tip approximately 6.6 cm from the carina. An orogastric tube is noted with its distal end looped within the body of the stomach. The heart size and mediastinal contours are within normal limits. Both lungs are clear. The visualized skeletal structures are unremarkable. IMPRESSION: Endotracheal tube and orogastric tube positioning, as described above. Electronically Signed   By: Aram Candela M.D.   On: 06/11/2020 22:22   DG Abd Portable 1V  Result Date: 2020-07-25 CLINICAL DATA:  OG tube advanced. EXAM: PORTABLE ABDOMEN - 1 VIEW COMPARISON:  Abdomen 07-25-2020.  CT 06/08/2009. FINDINGS:  Interim placement of OG tube, tip at the upper portion stomach. Side hole at the gastroesophageal junction. Advancement of 10 cm should be considered. Feeding tube noted with its tip in the distal stomach. Stomach remains dilated. Multiple dilated loops of small bowel again noted. Pneumatosis about the bowel loops over the left abdomen cannot be excluded. No free air identified. Noted on today's exam is a prominent amount of air noted over the liver. Pneumobilia or portal venous air could present in this fashion. No acute bony abnormality. IMPRESSION: 1. Interim placement of OG tube, tip over the upper stomach, side hole at the gastroesophageal junction. Advancement of approximately 10 cm should be considered. Feeding tube noted with tip over the distal stomach in unchanged position. 2. Persistent gastric distention. Persistent small-bowel distention suggestive possibility of small-bowel obstruction. No free air identified. Pneumatosis about bowel loops in the left abdomen cannot be excluded. The possibly of ischemic bowel should be considered. 3. Also noted on today's exam is a prominent amount of air noted over the liver. Pneumobilia or portal venous air could present in this fashion. CT of the abdomen and pelvis suggested for further evaluation. Critical Value/emergent results were called by telephone at the time of interpretation on 2020/07/25 at 6:35 am to provider nurse Judeth Cornfield, who verbally acknowledged these results. Electronically Signed   By: Maisie Fus  Register   On: 07/25/2020 06:38   DG Abd Portable 1V  Result Date: 07/05/2020 CLINICAL DATA:  Feeding tube placement. EXAM: PORTABLE ABDOMEN - 1 VIEW COMPARISON:  None.  FINDINGS: A nasogastric feeding tube is seen with its distal tip overlying the expected region of the gastric antrum. The bowel gas pattern is normal. No radio-opaque calculi or other significant radiographic abnormality are seen. IMPRESSION: Feeding tube positioning, as described above.  Electronically Signed   By: Aram Candela M.D.   On: 07/05/2020 17:09   EEG adult  Result Date: 07/05/2020 Charlsie Quest, MD     07/05/2020 11:02 AM Patient Name: Summer Sexton MRN: [id number] Epilepsy Attending: Charlsie Quest Referring Physician/Provider: Joneen Roach, NP Date: 07/05/2020 Duration: 22.29 mins Patient history: 65 year old female status post cardiac arrest.  EEG to evaluate for seizures. Level of alertness:  comatose/sedated AEDs during EEG study: None Technical aspects: This EEG study was done with scalp electrodes positioned according to the 10-20 International system of electrode placement. Electrical activity was acquired at a sampling rate of  and reviewed with a high frequency filter of  and a low frequency filter of . EEG data were recorded continuously and digitally stored. Description:EEG showed continuous generalized polymorphic 3 to 6 Hz theta-delta slowing.  EEG was reactive to tactile stimulation.  Hyperventilation and photic stimulation were not performed.   ABNORMALITY - Continuous slow, generalized IMPRESSION: This study is suggestive of moderate to severe diffuse encephalopathy, nonspecific etiology. No seizures or epileptiform discharges were seen throughout the recording. Charlsie Quest   ECHOCARDIOGRAM COMPLETE  Result Date: 07/05/2020    ECHOCARDIOGRAM REPORT   Patient Name:   Summer Sexton  Date of Exam: 07/05/2020 Medical Rec #:  [id number]  Height:       68.0 in Accession #:    [id number] Weight:       182.5 lb Date of Birth:  [birth year]  BSA:          1.966 m Patient Age:    64 years   BP:           102/68 mmHg Patient Gender: F          HR:           58 bpm. Exam Location:  Inpatient Procedure: 2D Echo, Color Doppler and Cardiac Doppler Indications:    Cardiac arrest  History:        Patient has no prior history of Echocardiogram examinations.                 Risk Factors:Hypertension.  Sonographer:    Shirlean Kelly Referring Phys: [id number] Beulah Gandy  PAYNE IMPRESSIONS  1. Left ventricular ejection fraction, by estimation, is 55%. The left ventricle has normal function. The left ventricle has no regional wall motion abnormalities. There is mild eccentric left ventricular hypertrophy of the septal segment. Left ventricular diastolic parameters are consistent with Grade I diastolic dysfunction (impaired relaxation).  2. Right ventricular systolic function is mildly reduced. The right ventricular size is normal. Tricuspid regurgitation signal is inadequate for assessing PA pressure.  3. The mitral valve is normal in structure. Trivial mitral valve regurgitation. No evidence of mitral stenosis.  4. The aortic valve was not well visualized. Aortic valve regurgitation is not visualized. No aortic stenosis is present.  5. The inferior vena cava is normal in size with greater than 50% respiratory variability, suggesting right atrial pressure of 3 mmHg. FINDINGS  Left Ventricle: Left ventricular ejection fraction, by estimation, is 55%. The left ventricle has normal function. The left ventricle has no regional wall motion abnormalities. The left ventricular internal cavity size was normal in size. There is mild eccentric left  ventricular hypertrophy of the septal segment. Left ventricular diastolic parameters are consistent with Grade I diastolic dysfunction (impaired relaxation). Right Ventricle: The right ventricular size is normal. No increase in right ventricular wall thickness. Right ventricular systolic function is mildly reduced. Tricuspid regurgitation signal is inadequate for assessing PA pressure. Left Atrium: Left atrial size was normal in size. Right Atrium: Right atrial size was normal in size. Pericardium: There is no evidence of pericardial effusion. Mitral Valve: The mitral valve is normal in structure. Trivial mitral valve regurgitation. No evidence of mitral valve stenosis. Tricuspid Valve: The tricuspid valve is normal in structure. Tricuspid valve  regurgitation is trivial. No evidence of tricuspid stenosis. Aortic Valve: The aortic valve was not well visualized. Aortic valve regurgitation is not visualized. No aortic stenosis is present. Aortic valve mean gradient measures 6.5 mmHg. Aortic valve peak gradient measures 12.3 mmHg. Aortic valve area, by VTI measures 2.06 cm. Pulmonic Valve: The pulmonic valve was not well visualized. Pulmonic valve regurgitation is not visualized. No evidence of pulmonic stenosis. Aorta: The aortic root is normal in size and structure. Venous: The inferior vena cava is normal in size with greater than 50% respiratory variability, suggesting right atrial pressure of 3 mmHg. IAS/Shunts: No atrial level shunt detected by color flow Doppler.  LEFT VENTRICLE PLAX 2D LVIDd:         4.70 cm  Diastology LVIDs:         3.60 cm  LV e' medial:    5.22 cm/s LV PW:         0.80 cm  LV E/e' medial:  11.1 LV IVS:        1.10 cm  LV e' lateral:   8.05 cm/s LVOT diam:     2.00 cm  LV E/e' lateral: 7.2 LV SV:         58 LV SV Index:   30 LVOT Area:     3.14 cm  RIGHT VENTRICLE             IVC RV S prime:     11.50 cm/s  IVC diam: 1.40 cm LEFT ATRIUM             Index       RIGHT ATRIUM           Index LA diam:        3.30 cm 1.68 cm/m  RA Area:     15.70 cm LA Vol (A2C):   49.9 ml 25.38 ml/m RA Volume:   37.80 ml  19.23 ml/m LA Vol (A4C):   54.2 ml 27.57 ml/m LA Biplane Vol: 51.9 ml 26.40 ml/m  AORTIC VALVE AV Area (Vmax):    2.35 cm AV Area (Vmean):   2.26 cm AV Area (VTI):     2.06 cm AV Vmax:           175.50 cm/s AV Vmean:          113.000 cm/s AV VTI:            0.284 m AV Peak Grad:      12.3 mmHg AV Mean Grad:      6.5 mmHg LVOT Vmax:         131.00 cm/s LVOT Vmean:        81.200 cm/s LVOT VTI:          0.186 m LVOT/AV VTI ratio: 0.65  AORTA Ao Root diam: 3.20 cm Ao Asc diam:  3.10 cm MITRAL VALVE MV Area (PHT): 2.87  cm     SHUNTS MV Decel Time: 264 msec     Systemic VTI:  0.19 m MV E velocity: 58.20 cm/s   Systemic Diam: 2.00  cm MV A velocity: 108.00 cm/s MV E/A ratio:  0.54 Weston Brass MD Electronically signed by Weston Brass MD Signature Date/Time: 07/05/2020/3:25:39 PM    Final    US Abdomen Limited RUQ (LIVER/GB)  Result Date: 07/05/2020 CLINICAL DATA:  Transaminitis. EXAM: ULTRASOUND ABDOMEN LIMITED RIGHT UPPER QUADRANT COMPARISON:  CT 06/08/2009. FINDINGS: Gallbladder: No gallstones or wall thickening visualized. No sonographic Murphy sign noted by sonographer. Common bile duct: Diameter: 3.8 mm Liver: Left hepatic lobe difficult visualized. Increased hepatic echogenicity consistent fatty infiltration and or hepatocellular disease. Small area of decreased attenuation noted adjacent to the gallbladder fossa, most likely focal fatty sparing. Portal vein is patent on color Doppler imaging with normal direction of blood flow towards the liver. Other: None. IMPRESSION: 1.  No gallstones or biliary distention. 2. Left hepatic lobe poorly visualized. Increased hepatic echogenicity consistent with fatty infiltration or hepatocellular disease. Small area of decreased attenuation noted adjacent to the gallbladder fossa, most likely focal fatty sparing. Follow-up ultrasound in 3 months can be obtained to ensure stability of this area of probable focal fatty sparing. Electronically Signed   By: Maisie Fus  Register   On: 07/05/2020 06:40    Cardiac Studies   Echo from 07/05/20:   1. Left ventricular ejection fraction, by estimation, is 55%. The left  ventricle has normal function. The left ventricle has no regional wall  motion abnormalities. There is mild eccentric left ventricular hypertrophy  of the septal segment. Left  ventricular diastolic parameters are consistent with Grade I diastolic  dysfunction (impaired relaxation).   2. Right ventricular systolic function is mildly reduced. The right  ventricular size is normal. Tricuspid regurgitation signal is inadequate  for assessing PA pressure.   3. The mitral valve is  normal in structure. Trivial mitral valve  regurgitation. No evidence of mitral stenosis.   4. The aortic valve was not well visualized. Aortic valve regurgitation  is not visualized. No aortic stenosis is present.   5. The inferior vena cava is normal in size with greater than 50%  respiratory variability, suggesting right atrial pressure of 3 mmHg.   Patient Profile     Summer Sexton is a 65 y.o. female with a hx of HTN, HLD, hypothyroidism, CKD stage III, fatty liver disease, cardiac murmur, hx of DIC in 1960s while giving birth, fibromyalgia,  who is being seen 07/05/2020 for the evaluation of cardiac arrest at the request of Dr. Melynda Ripple.   Assessment & Plan     Unwitnessed out of hospital cardiac arrest Ventricular fibrillation requiring multiple defibrillation by EMS NSTEMI -See H&P for history -Hs troponin 375 >8487 > 6803 >20678, ACS versus demand -EKG unspecific -Echo with EF 55%., no RMWA, grade I DD, no significant valvular disease  -cardiac cath not pursued as patient had no meaningful recovery of neuro exam and worsening AKI  - family had transitioned the patient to comfort measures only today, condolence offered    Post arrest shock - Post cardiac arrest - Echo as above - Coox 77.7%, query sepsis, unlikely cardiogenic    - sepsis/DKA workup per PCCM   Acute metabolic encephalopathy due to anoxia - prolonged CPR 40 min to 1 hour - EEG with generalized slowing, diffuse encephalopathy, no status - Unfortunately she had no neuro  AKI/ATN-oliguria  Ischemic hepatitis Anion gap metabolic acidosis -  duet to shock/cardiac arrest - renal index progressively worsening  - comfort measures today    Ventilatory dependent respiratory failure Presumed aspiration Hyperglycemia HTN Hypothyroidism Hx of DIC in 1960s  For questions or updates, please contact [facility] Please consult [email or web address] for contact info under       Signed, Cyndi BenderXika Zhao, NP  07/13/2020, 10:46 AM     Chart reviewed. Pt compassionately extubated at 11:38 this morning. No charge for services. Had lengthy discussion with husband and daughter yesterday evening.   Tonny BollmanMichael Farmer Mccahill 07/13/2020 1:12 PM

## 2020-07-07 NOTE — Progress Notes (Signed)
NAME:  Summer Sexton, MRN:  [id number], DOB:  Dec 28, 1955, LOS: 2 ADMISSION DATE:  06/16/2020, CONSULTATION DATE:  6/29 REFERRING MD:  Dr. Madilyn Hook, CHIEF COMPLAINT:  post cardiac arrest   History of Present Illness:  Patient is a 65 yo F w/ pertinent PMH of hypothyroidism, HLD, HTN w/ kidney disease, and murmur presents to [facility] on 6/29 w/ cardiac arrest.  Patient was found down by husband and he started CPR. Firefighters arrived on scene and continued CPR. EMS arrived and continued CPR, placed ETT, and transported patient to [facility]. Total CPR time about 1 hour until ROSC. Purposeful movement noticed by EMS post ROSC. She received multiple defibrillations for vfib rhythm and was treated with 9 epi, 450 amio, 2 g mag, 2 g bicarb, 2.5 Versed, 100 lidocaine, 50 fentanyl, 6 g epi drip.   PCCM consulted for icu admission and medical management.   Significant Hospital Events: Including procedures, antibiotic start and stop dates in addition to other pertinent events   6/29: admitted to [facility]; post cardiac arrest;  ~40 min to 1 hour ROSC; vfib rhythm; multiple shocks; epi, bicarb, calcium, amio, mag, and lidocaine given. Intubated on epi drip. Non responsive to painful stimuli. But per EMS was purposeful. CT brain neg for bleed or clear infarcts. Abd [place] questioning fatty liver disease vs hepatocellular disease  6/29 CVL for shock. Cultures sent. Starting Unasyn for aspiration  6/30 - worsening shock, acidosis  Port KUB 6/30- 1. Interim placement of OG tube, tip over the upper stomach, side hole at the gastroesophageal junction. Advancement of approximately 10 cm should be considered. Feeding tube noted with tip over the distal stomach in unchanged position.   2. Persistent gastric distention. Persistent small-bowel distention suggestive possibility of small-bowel obstruction. No free air identified. Pneumatosis about bowel loops in the left abdomen cannot be excluded. The possibly of ischemic bowel should be  considered.   3. Also noted on today's exam is a prominent amount of air noted over the liver. Pneumobilia or portal venous air could present in this fashion. CT of the abdomen and pelvis suggested for further evaluation.  Interim History / Subjective:  Worsening shock overnight.  Concern for abdominal catastrophe, gas in portal system.  Worsening acidosis. Family at bedside.   Objective   Blood pressure 126/70, pulse 85, temperature 98.96 F (37.2 C), resp. rate (!) 28, height 5\' 8"  (1.727 m), weight 83 kg, SpO2 95 %. CVP:  [7 mmHg-10 mmHg] 9 mmHg  Vent Mode: PRVC FiO2 (%):  [30 %] 30 % Set Rate:  [20 bmp] 20 bmp Vt Set:  [510 mL] 510 mL PEEP:  [5 cmH20] 5 cmH20 Plateau Pressure:  [16 cmH20-20 cmH20] 17 cmH20   Intake/Output Summary (Last 24 hours) at 08/01/20 0911 Last data filed at 2020/08/01 0700 Gross per 24 hour  Intake 5265.02 ml  Output 230 ml  Net 5035.02 ml   Filed Weights   07/05/20 0215 2020/08/01 0419  Weight: 82.8 kg 83 kg    Examination:  General:  wdwn female, critically ill appearing, NAD HEENT: MM pink/moist, ETT Neuro: sedated on vent, RASS -2, easily agitated, does not follow commands per nursing  CV: s1s2 rrr, no m/r/g PULM:  resps even non labored on vent, coarse GI: soft, bsx4 active  Extremities: warm/dry, 1+BLE edema  Skin: no rashes or lesions     Resolved Hospital Problem list     Assessment & Plan:  Cardiac arrest [facility]) Out of  Hospital cardiac arrest. Has h/o HTN and  cardiac murmur, trop starting to trend down.   Cardiogenic shock (HCC) Post cardiac arrest cardiogenic shock. Remains on epinephrine gtt. Has elevated PCT so can't exclude element of sepsis. Worsening acidosis.   Acute respiratory failure with hypoxemia (HCC) Initial PCXR ett good position.  Bibasilar volume loss.  Cannot exclude an infiltrate.  Certainly has a good story for aspiration given prolonged CPR   Lactic acid acidosis In the setting of cardiac arrest and  shock.  Also has mild elevation of beta hydroxybutyric acid which suggest mild component of DKA however lactate primary driving issue in her anion gap metabolic acidosis  Concern for intra-abdominal event/ischemic bowel and/or SBO. Gas in portal system  Electrolyte imbalance:Hypokalemia  Acute renal failure superimposed on stage 2 chronic kidney disease (HCC) Normal baseline creatinine around 1.09; 6 months ago, however GFR was 55, serum creatinine climbing up to 2.18->5.06 suspect exacerbated by ongoing shock   Elevated LFTs Likely status post cardiac arrest, however Abdominal ultrasound showed increased hepatic echogenicity consistent with fatty infiltrate versus hepatocellular disease   Acute metabolic encephalopathy Following cardiac arrest.  Downtime with CPR estimated at 43 minutes however was apparently propofol post arrest CT brain normal   Hyperglycemia Hyperammonemia (HCC) Hypothyroidism History of hyperlipidemia Leukocytosis   Discussion at bedside with husband and son by Dr. Judeth Horn. See additional note.  Now DNR, additional family en route. Plans for extubation and comfort care when family ready.    My cct 36 minutes.  Dirk Dress, NP Pulmonary/Critical Care Medicine  07/01/2020  9:11 AM

## 2020-07-07 NOTE — Progress Notes (Signed)
ANTICOAGULATION CONSULT NOTE  Pharmacy Consult for IV Heparin Indication: chest pain/ACS  No Known Allergies  Patient Measurements: Height: 5\' 8"  (172.7 cm) Weight: 82.8 kg (182 lb 8.7 oz) IBW/kg (Calculated) : 63.9 Heparin Dosing Weight:  80.8 kg  Vital Signs: Temp: 102.5 F (39.2 C) (06/30 0200) Temp Source: Axillary (06/30 0200) Pulse Rate: 94 (06/29 2310)  Labs: Recent Labs    06/07/2020 2206 06/20/2020 2231 07/05/20 0255 07/05/20 0314 07/05/20 0405 07/05/20 1112 07/05/20 1127 07/05/20 2205 Aug 02, 2020 0213  HGB 12.3   < > 14.6 15.2*  --   --  13.6  --   --   HCT 38.9   < > 43.0 44.0  --   --  40.0  --   --   PLT 210  --   --  237  --   --   --   --   --   HEPARINUNFRC  --   --   --   --   --   --   --   --  0.36  CREATININE 1.97*   < >  --  2.18*  --  3.13*  --  3.82*  --   TROPONINIHS 375*  --   --  07/08/20* 6,803* 20,678*  --   --   --    < > = values in this interval not displayed.     Estimated Creatinine Clearance: 17 mL/min (A) (by C-G formula based on SCr of 3.82 mg/dL (H)).   Medical History: Past Medical History:  Diagnosis Date   Arthritis    Fibromyalgia    Hypertension    Kidney disease    Liver disease    Murmur    Right bundle branch block    Assessment: 65 yr old female with CKD presented to ED on 06/11/2020 following unwitnessed out of hospital cardiac arrest with v fib requiring multiple defibrillation by EMS and was diagnosed with NSTEMI. Pharmacy is consulted for IV heparin dosing; pt was not on anticoagulant PTA.   H/H 13.6/40.0, plt 237; Scr rising since admission, now up to 3.13  Pt rec'd heparin 5000 units SQ at 1310 this afternoon.  Cardiology is recommending cardiac cath  6/30 AM update:  Heparin level therapeutic  Goal of Therapy:  Heparin level 0.3-0.7 units/ml Monitor platelets by anticoagulation protocol: Yes   Plan:  Cont heparin 950 units/hr 1000 heparin level  7/30, PharmD, BCPS Clinical Pharmacist Phone:  [phone]

## 2020-07-07 NOTE — Progress Notes (Signed)
Nutrition Brief Note ° °Chart reviewed. °Pt now transitioning to comfort care.  °No further nutrition interventions planned at this time.  °Please re-consult as needed. ° ° °Kate Minta Fair, MS, RD, LDN °Inpatient Clinical Dietitian °Please see AMiON for contact information. ° ° °

## 2020-07-07 NOTE — Progress Notes (Signed)
PHARMACY NOTE:  ANTIMICROBIAL RENAL DOSAGE ADJUSTMENT  Current antimicrobial regimen includes a mismatch between antimicrobial dosage and estimated renal function.  As per policy approved by the Pharmacy & Therapeutics and Medical Executive Committees, the antimicrobial dosage will be adjusted accordingly.  Current antimicrobial dosage:  Unasyn 1.5 gm IV q12hr  Indication: r/o aspiration PNA  Renal Function:  Estimated Creatinine Clearance: 12.8 mL/min (A) (by C-G formula based on SCr of 5.06 mg/dL (H)).  Antimicrobial dosage has been changed to:  Unasyn 1.5 gm IV q24hr   Thank you for allowing pharmacy to be a part of this patient's care.  Jeanella Cara, PharmD, [facility] Clinical Pharmacist Please see AMION for all Pharmacists' Contact Phone Numbers Jul 31, 2020, 7:11 AM

## 2020-07-07 NOTE — Progress Notes (Signed)
Patient was compassionately extubated at 1138 per MD order/family wishes. Family was present at bedside.

## 2020-07-07 NOTE — Progress Notes (Addendum)
eLink Physician-Brief Progress Note Patient Name: Summer Sexton DOB: 09-17-1955 MRN: [id number]   Date of Service  07/24/20  HPI/Events of Note  PH 7.15, HCO3 13, KUB with marked small bowel and gastric distension r/o obstruction.  eICU Interventions  Sodium bicarbonate 1 amp given iv push and sodium bicarbonate infusion started at 50 ml / hour. General surgery consulted for possible small bowel obstruction.        [name] 2020/07/24, 4:25 AM

## 2020-07-07 NOTE — Death Summary Note (Addendum)
DEATH SUMMARY   Patient Details  Name: Summer Sexton MRN: [id number] DOB: 28-Oct-1955  Admission/Discharge Information   Admit Date:  23-Jul-2020  Date of Death:  2020-07-25  Time of Death:  24-Mar-1203   Length of Stay: 2  Referring Physician: Fanny Bien, MD   Reason(s) for Hospitalization  Cardiac Arrest, VF  Diagnoses  Preliminary cause of death:  Secondary Diagnoses (including complications and co-morbidities):  Active Problems:   Cardiac arrest (HCC)   Acute respiratory failure with hypoxemia (HCC)   Acute renal failure superimposed on stage 2 chronic kidney disease (HCC)   Elevated LFTs   Cardiogenic shock (HCC)   Electrolyte imbalance:Hypokalemia   Lactic acid acidosis   Acute metabolic encephalopathy   Hyperglycemia   Hyperammonemia (HCC)   Hypothyroidism   Diarrhea   History of depression   History of hyperlipidemia   Leukocytosis   Transaminitis   Aspiration pneumonia of both lower lobes due to gastric secretions (HCC)   Aspiration pneumonia due to gastric secretions ([place])   Encounter for central line placement   DNR (do not resuscitate)   Comfort measures only status   Brief Hospital Course (including significant findings, care, treatment, and services provided and events leading to death)  Summer Sexton is a 65 y.o. year old female who was admitted to the ICU after prolonged arrest, CPR, defibs for 40+ minutes. Intubated. Hypotensive. Responded to fluids. EF ok on TTE. Renal failure present. Semi-purposeful. EEG diffuse slowing present. Developed worsening hypotension. Vomited. Abd XR with concern for SBO and possible ischemic bowel, pneumobilia present. Lactate rose. Consistent with ischemic bowel. Poor surgical candidate. Renal failure, acidemia worsened, hypotension. Met with family, decision for comfort care was made. She was extubated and vasopressors turned off. She passed away at 1205 surrounded by family.    Pertinent Labs and Studies  Significant Diagnostic  Studies DG Abd 1 View  Result Date: 07/25/2020 CLINICAL DATA:  Aspiration, von EXAM: ABDOMEN - 1 VIEW COMPARISON:  07/05/2020 FINDINGS: Feeding tube is in place with the tip in the distal stomach. There is gaseous distention of the stomach. Dilated small bowel loops in the abdomen and pelvis. Cannot exclude small bowel obstruction. No free air organomegaly. IMPRESSION: Gaseous distention of the stomach and small bowel concerning for small bowel obstruction. Feeding tube tip in the distal stomach. Electronically Signed   By: Rolm Baptise M.D.   On: 07/25/2020 03:56   CT Head Wo Contrast  Result Date: 07-23-20 CLINICAL DATA:  Cardiac arrest.  Altered mental status EXAM: CT HEAD WITHOUT CONTRAST TECHNIQUE: Contiguous axial images were obtained from the base of the skull through the vertex without intravenous contrast. COMPARISON:  None. FINDINGS: Brain: No acute intracranial abnormality. Specifically, no hemorrhage, hydrocephalus, mass lesion, acute infarction, or significant intracranial injury. Vascular: No hyperdense vessel or unexpected calcification. Skull: No acute calvarial abnormality. Sinuses/Orbits: No acute findings Other: None IMPRESSION: No acute intracranial abnormality. Electronically Signed   By: Rolm Baptise M.D.   On: July 23, 2020 23:30   DG Chest Port 1 View  Result Date: 07-25-2020 CLINICAL DATA:  Aspiration, vomiting EXAM: PORTABLE CHEST 1 VIEW COMPARISON:  07/05/2020 FINDINGS: Cardiomegaly. Low lung volumes. Increasing bibasilar opacities, likely atelectasis. Left central line, endotracheal tube and feeding tube remain in place, unchanged. Gaseous distention of the stomach. IMPRESSION: Worsening aeration with low lung volumes and bibasilar atelectasis. Support devices stable. Gaseous distention of the stomach. Electronically Signed   By: Rolm Baptise M.D.   On: 07-25-20 03:55   DG Chest [facility]  1 View  Result Date: 07/05/2020 CLINICAL DATA:  Status post central line placement EXAM:  PORTABLE CHEST 1 VIEW COMPARISON:  Film from earlier in the same day. FINDINGS: Endotracheal tube and gastric catheter are again seen and stable. Left jugular central line is noted with the catheter tip in the distal superior vena cava. No pneumothorax is noted. Cardiac shadow is stable. Lungs are clear bilaterally. IMPRESSION: Left jugular central line in satisfactory position. No pneumothorax is noted. Electronically Signed   By: Inez Catalina M.D.   On: 07/05/2020 13:23   DG CHEST PORT 1 VIEW  Result Date: 07/05/2020 CLINICAL DATA:  Cardiac arrest. EXAM: PORTABLE CHEST 1 VIEW COMPARISON:  06/12/2020. FINDINGS: Endotracheal tube, NG tube in stable position. Heart size normal. Low lung volumes with mild bibasilar atelectasis/infiltrates. Small left pleural effusion cannot be excluded. Left costophrenic angle incompletely imaged. No pneumothorax. IMPRESSION: 1.  Lines and tubes in stable position. 2. Low lung volumes with mild bibasilar atelectasis/infiltrates. Small left pleural effusion cannot be excluded. Electronically Signed   By: Marcello Moores  Register   On: 07/05/2020 05:32   DG Chest Port 1 View  Result Date: 07/02/2020 CLINICAL DATA:  Status post CPR for line placement. EXAM: PORTABLE CHEST 1 VIEW COMPARISON:  None. FINDINGS: Overlying radiopaque tags and cardiac lead wires are seen. An endotracheal tube is seen with its distal tip approximately 6.6 cm from the carina. An orogastric tube is noted with its distal end looped within the body of the stomach. The heart size and mediastinal contours are within normal limits. Both lungs are clear. The visualized skeletal structures are unremarkable. IMPRESSION: Endotracheal tube and orogastric tube positioning, as described above. Electronically Signed   By: Virgina Norfolk M.D.   On: 06/08/2020 22:22   DG Abd Portable 1V  Result Date: 07/21/2020 CLINICAL DATA:  OG tube advanced. EXAM: PORTABLE ABDOMEN - 1 VIEW COMPARISON:  Abdomen July 21, 2020.  CT  06/08/2009. FINDINGS: Interim placement of OG tube, tip at the upper portion stomach. Side hole at the gastroesophageal junction. Advancement of 10 cm should be considered. Feeding tube noted with its tip in the distal stomach. Stomach remains dilated. Multiple dilated loops of small bowel again noted. Pneumatosis about the bowel loops over the left abdomen cannot be excluded. No free air identified. Noted on today's exam is a prominent amount of air noted over the liver. Pneumobilia or portal venous air could present in this fashion. No acute bony abnormality. IMPRESSION: 1. Interim placement of OG tube, tip over the upper stomach, side hole at the gastroesophageal junction. Advancement of approximately 10 cm should be considered. Feeding tube noted with tip over the distal stomach in unchanged position. 2. Persistent gastric distention. Persistent small-bowel distention suggestive possibility of small-bowel obstruction. No free air identified. Pneumatosis about bowel loops in the left abdomen cannot be excluded. The possibly of ischemic bowel should be considered. 3. Also noted on today's exam is a prominent amount of air noted over the liver. Pneumobilia or portal venous air could present in this fashion. CT of the abdomen and pelvis suggested for further evaluation. Critical Value/emergent results were called by telephone at the time of interpretation on Jul 21, 2020 at 6:35 am to provider nurse Colletta Maryland, who verbally acknowledged these results. Electronically Signed   By: Marcello Moores  Register   On: Jul 21, 2020 06:38   DG Abd Portable 1V  Result Date: 07/05/2020 CLINICAL DATA:  Feeding tube placement. EXAM: PORTABLE ABDOMEN - 1 VIEW COMPARISON:  None. FINDINGS: A nasogastric feeding tube is seen  with its distal tip overlying the expected region of the gastric antrum. The bowel gas pattern is normal. No radio-opaque calculi or other significant radiographic abnormality are seen. IMPRESSION: Feeding tube positioning,  as described above. Electronically Signed   By: Virgina Norfolk M.D.   On: 07/05/2020 17:09   EEG adult  Result Date: 07/05/2020 Lora Havens, MD     07/05/2020 11:02 AM Patient Name: Mafalda Mcginniss MRN: [id number] Epilepsy Attending: Lora Havens Referring Physician/Provider: Georgann Housekeeper, NP Date: 07/05/2020 Duration: 22.29 mins Patient history: 65 year old female status post cardiac arrest.  EEG to evaluate for seizures. Level of alertness:  comatose/sedated AEDs during EEG study: None Technical aspects: This EEG study was done with scalp electrodes positioned according to the 10-20 International system of electrode placement. Electrical activity was acquired at a sampling rate of 500Hz  and reviewed with a high frequency filter of 70Hz  and a low frequency filter of 1Hz . EEG data were recorded continuously and digitally stored. Description:EEG showed continuous generalized polymorphic 3 to 6 Hz theta-delta slowing.  EEG was reactive to tactile stimulation.  Hyperventilation and photic stimulation were not performed.   ABNORMALITY - Continuous slow, generalized IMPRESSION: This study is suggestive of moderate to severe diffuse encephalopathy, nonspecific etiology. No seizures or epileptiform discharges were seen throughout the recording. Lora Havens   ECHOCARDIOGRAM COMPLETE  Result Date: 07/05/2020    ECHOCARDIOGRAM REPORT   Patient Name:   BETSAIDA MISSOURI  Date of Exam: 07/05/2020 Medical Rec #:  [id number]  Height:       68.0 in Accession #:    [id number] Weight:       182.5 lb Date of Birth:  03-12-1955  BSA:          1.966 m Patient Age:    44 years   BP:           102/68 mmHg Patient Gender: F          HR:           58 bpm. Exam Location:  Inpatient Procedure: 2D Echo, Color Doppler and Cardiac Doppler Indications:    Cardiac arrest  History:        Patient has no prior history of Echocardiogram examinations.                 Risk Factors:Hypertension.  Sonographer:    Cammy Brochure Referring Phys:  [id number] [place]  1. Left ventricular ejection fraction, by estimation, is 55%. The left ventricle has normal function. The left ventricle has no regional wall motion abnormalities. There is mild eccentric left ventricular hypertrophy of the septal segment. Left ventricular diastolic parameters are consistent with Grade I diastolic dysfunction (impaired relaxation).  2. Right ventricular systolic function is mildly reduced. The right ventricular size is normal. Tricuspid regurgitation signal is inadequate for assessing PA pressure.  3. The mitral valve is normal in structure. Trivial mitral valve regurgitation. No evidence of mitral stenosis.  4. The aortic valve was not well visualized. Aortic valve regurgitation is not visualized. No aortic stenosis is present.  5. The inferior vena cava is normal in size with greater than 50% respiratory variability, suggesting right atrial pressure of 3 mmHg. FINDINGS  Left Ventricle: Left ventricular ejection fraction, by estimation, is 55%. The left ventricle has normal function. The left ventricle has no regional wall motion abnormalities. The left ventricular internal cavity size was normal in size. There is mild eccentric left ventricular hypertrophy of the septal segment. Left  ventricular diastolic parameters are consistent with Grade I diastolic dysfunction (impaired relaxation). Right Ventricle: The right ventricular size is normal. No increase in right ventricular wall thickness. Right ventricular systolic function is mildly reduced. Tricuspid regurgitation signal is inadequate for assessing PA pressure. Left Atrium: Left atrial size was normal in size. Right Atrium: Right atrial size was normal in size. Pericardium: There is no evidence of pericardial effusion. Mitral Valve: The mitral valve is normal in structure. Trivial mitral valve regurgitation. No evidence of mitral valve stenosis. Tricuspid Valve: The tricuspid valve is normal in structure.  Tricuspid valve regurgitation is trivial. No evidence of tricuspid stenosis. Aortic Valve: The aortic valve was not well visualized. Aortic valve regurgitation is not visualized. No aortic stenosis is present. Aortic valve mean gradient measures 6.5 mmHg. Aortic valve peak gradient measures 12.3 mmHg. Aortic valve area, by VTI measures 2.06 cm. Pulmonic Valve: The pulmonic valve was not well visualized. Pulmonic valve regurgitation is not visualized. No evidence of pulmonic stenosis. Aorta: The aortic root is normal in size and structure. Venous: The inferior vena cava is normal in size with greater than 50% respiratory variability, suggesting right atrial pressure of 3 mmHg. IAS/Shunts: No atrial level shunt detected by color flow Doppler.  LEFT VENTRICLE PLAX 2D LVIDd:         4.70 cm  Diastology LVIDs:         3.60 cm  LV e' medial:    5.22 cm/s LV PW:         0.80 cm  LV E/e' medial:  11.1 LV IVS:        1.10 cm  LV e' lateral:   8.05 cm/s LVOT diam:     2.00 cm  LV E/e' lateral: 7.2 LV SV:         58 LV SV Index:   30 LVOT Area:     3.14 cm  RIGHT VENTRICLE             IVC RV S prime:     11.50 cm/s  IVC diam: 1.40 cm LEFT ATRIUM             Index       RIGHT ATRIUM           Index LA diam:        3.30 cm 1.68 cm/m  RA Area:     15.70 cm LA Vol (A2C):   49.9 ml 25.38 ml/m RA Volume:   37.80 ml  19.23 ml/m LA Vol (A4C):   54.2 ml 27.57 ml/m LA Biplane Vol: 51.9 ml 26.40 ml/m  AORTIC VALVE AV Area (Vmax):    2.35 cm AV Area (Vmean):   2.26 cm AV Area (VTI):     2.06 cm AV Vmax:           175.50 cm/s AV Vmean:          113.000 cm/s AV VTI:            0.284 m AV Peak Grad:      12.3 mmHg AV Mean Grad:      6.5 mmHg LVOT Vmax:         131.00 cm/s LVOT Vmean:        81.200 cm/s LVOT VTI:          0.186 m LVOT/AV VTI ratio: 0.65  AORTA Ao Root diam: 3.20 cm Ao Asc diam:  3.10 cm MITRAL VALVE MV Area (PHT): 2.87 cm     SHUNTS MV  Decel Time: 264 msec     Systemic VTI:  0.19 m MV E velocity: 58.20 cm/s    Systemic Diam: 2.00 cm MV A velocity: 108.00 cm/s MV E/A ratio:  0.54 Cherlynn Kaiser MD Electronically signed by Cherlynn Kaiser MD Signature Date/Time: 07/05/2020/3:25:39 PM    Final    US Abdomen Limited RUQ (LIVER/GB)  Result Date: 07/05/2020 CLINICAL DATA:  Transaminitis. EXAM: ULTRASOUND ABDOMEN LIMITED RIGHT UPPER QUADRANT COMPARISON:  CT 06/08/2009. FINDINGS: Gallbladder: No gallstones or wall thickening visualized. No sonographic Murphy sign noted by sonographer. Common bile duct: Diameter: 3.8 mm Liver: Left hepatic lobe difficult visualized. Increased hepatic echogenicity consistent fatty infiltration and or hepatocellular disease. Small area of decreased attenuation noted adjacent to the gallbladder fossa, most likely focal fatty sparing. Portal vein is patent on color Doppler imaging with normal direction of blood flow towards the liver. Other: None. IMPRESSION: 1.  No gallstones or biliary distention. 2. Left hepatic lobe poorly visualized. Increased hepatic echogenicity consistent with fatty infiltration or hepatocellular disease. Small area of decreased attenuation noted adjacent to the gallbladder fossa, most likely focal fatty sparing. Follow-up ultrasound in 3 months can be obtained to ensure stability of this area of probable focal fatty sparing. Electronically Signed   By: Marcello Moores  Register   On: 07/05/2020 06:40    Microbiology Recent Results (from the past 240 hour(s))  Resp Panel by RT-PCR (Flu A&B, Covid) Nasopharyngeal Swab     Status: None   Collection Time: 06/09/2020 10:42 PM   Specimen: Nasopharyngeal Swab; Nasopharyngeal(NP) swabs in vial transport medium  Result Value Ref Range Status   SARS Coronavirus 2 by RT PCR NEGATIVE NEGATIVE Final    Comment: (NOTE) SARS-CoV-2 target nucleic acids are NOT DETECTED.  The SARS-CoV-2 RNA is generally detectable in upper respiratory specimens during the acute phase of infection. The lowest concentration of SARS-CoV-2 viral copies  this assay can detect is 138 copies/mL. A negative result does not preclude SARS-Cov-2 infection and should not be used as the sole basis for treatment or other patient management decisions. A negative result may occur with  improper specimen collection/handling, submission of specimen other than nasopharyngeal swab, presence of viral mutation(s) within the areas targeted by this assay, and inadequate number of viral copies(<138 copies/mL). A negative result must be combined with clinical observations, patient history, and epidemiological information. The expected result is Negative.  Fact Sheet for Patients:  EntrepreneurPulse.com.au  Fact Sheet for Healthcare Providers:  IncredibleEmployment.be  This test is no t yet approved or cleared by the [place] FDA and  has been authorized for detection and/or diagnosis of SARS-CoV-2 by FDA under an Emergency Use Authorization (EUA). This EUA will remain  in effect (meaning this test can be used) for the duration of the COVID-19 declaration under Section 564(b)(1) of the Act, 21 U.S.C.section 360bbb-3(b)(1), unless the authorization is terminated  or revoked sooner.       Influenza A by PCR NEGATIVE NEGATIVE Final   Influenza B by PCR NEGATIVE NEGATIVE Final    Comment: (NOTE) The Xpert Xpress SARS-CoV-2/FLU/RSV plus assay is intended as an aid in the diagnosis of influenza from Nasopharyngeal swab specimens and should not be used as a sole basis for treatment. Nasal washings and aspirates are unacceptable for Xpert Xpress SARS-CoV-2/FLU/RSV testing.  Fact Sheet for Patients: EntrepreneurPulse.com.au  Fact Sheet for Healthcare Providers: IncredibleEmployment.be  This test is not yet approved or cleared by the [place] FDA and has been authorized for detection and/or diagnosis of SARS-CoV-2  by FDA under an Emergency Use Authorization (EUA). This EUA  will remain in effect (meaning this test can be used) for the duration of the COVID-19 declaration under Section 564(b)(1) of the Act, 21 U.S.C. section 360bbb-3(b)(1), unless the authorization is terminated or revoked.  Performed at [facility], [place] [address], [place], [place] [address]   Urine culture     Status: None   Collection Time: 06/23/2020 10:56 PM   Specimen: Urine, Random  Result Value Ref Range Status   Specimen Description URINE, RANDOM  Final   Special Requests NONE  Final   Culture   Final    NO GROWTH Performed at [facility], [place] [address] [place], [place] [address]    Report Status 07/21/2020 FINAL  Final  Culture, blood (routine x 2)     Status: None (Preliminary result)   Collection Time: 07/05/20  3:06 AM   Specimen: BLOOD RIGHT ARM  Result Value Ref Range Status   Specimen Description BLOOD RIGHT ARM  Final   Special Requests   Final    BOTTLES DRAWN AEROBIC AND ANAEROBIC Blood Culture adequate volume   Culture   Final    NO GROWTH < 24 HOURS Performed at [facility], [place] [address], [place], [place] [address]    Report Status PENDING  Incomplete  Culture, blood (routine x 2)     Status: None (Preliminary result)   Collection Time: 07/05/20  3:08 AM   Specimen: BLOOD RIGHT HAND  Result Value Ref Range Status   Specimen Description BLOOD RIGHT HAND  Final   Special Requests   Final    BOTTLES DRAWN AEROBIC AND ANAEROBIC Blood Culture adequate volume   Culture   Final    NO GROWTH < 24 HOURS Performed at [facility], [place] [address], [place], [place] [address]    Report Status PENDING  Incomplete  C Difficile Quick Screen w PCR reflex     Status: None   Collection Time: 07/05/20  4:15 AM   Specimen: STOOL  Result Value Ref Range Status   C Diff antigen NEGATIVE NEGATIVE Final   C Diff toxin NEGATIVE NEGATIVE Final   C Diff interpretation No C. difficile detected.  Final    Comment: Performed at [facility], [place] [address], [place], [place] [address]  MRSA Next Gen by PCR, Nasal     Status: None   Collection Time: 07/05/20  8:12 AM  Result Value Ref Range Status   MRSA by PCR Next Gen NOT DETECTED NOT DETECTED Final    Comment: (NOTE) The GeneXpert MRSA Assay (FDA approved for NASAL specimens only), is one component of a comprehensive MRSA colonization surveillance program. It is not intended to diagnose MRSA infection nor to guide or monitor treatment for MRSA infections. Test performance is not FDA approved in patients less than 41 years old. Performed at [facility], [place] [address], [place], [place] [address]   Culture, Respiratory w Gram Stain     Status: None (Preliminary result)   Collection Time: 07/05/20  9:20 AM   Specimen: Tracheal Aspirate; Respiratory  Result Value Ref Range Status   Specimen Description TRACHEAL ASPIRATE  Final   Special Requests NONE  Final   Gram Stain   Final    FEW WBC PRESENT, PREDOMINANTLY PMN MODERATE GRAM POSITIVE COCCI IN PAIRS IN CLUSTERS MODERATE GRAM NEGATIVE RODS    Culture   Final  CULTURE REINCUBATED FOR BETTER GROWTH Performed at [facility], [place] [address], [place], [place] [address]    Report Status PENDING  Incomplete    Lab Basic Metabolic Panel: Recent Labs  Lab 06/24/2020 2206 06/12/2020 2231 06/29/2020 2233 07/05/20 0314 07/05/20 1112 07/05/20 1127 07/05/20 2205 07/05/20 2239 2020/07/18 0401 07-18-20 0413  NA 140 139   < > 142 142 143 139  --  139 140  K 3.8 3.3*   < > 2.8* 2.9* 2.9* 4.4  --  5.8* 4.9  CL 105 107  --  111 111  --  113*  --   --  115*  CO2 13*  --   --  15* 15*  --  16*  --   --  14*  GLUCOSE 315* 304*  --  199* 238*  --  159*  --   --  127*  BUN 25* 29*  --  32* 42*  --  52*  --   --  57*  CREATININE 1.97* 1.80*  --  2.18* 3.13*  --  3.82*  --   --  5.06*  CALCIUM 11.0*  --   --  11.0* 9.8  --  8.8*  --   --  8.6*  MG  --   --   --  3.3* 3.2*  --  2.7*  --   --  2.8*  PHOS  --   --   --    --  5.9*  --   --  4.4  --  3.9   < > = values in this interval not displayed.   Liver Function Tests: Recent Labs  Lab 06/29/2020 2206 July 18, 2020 0413  AST 351* 307*  ALT 377* 240*  ALKPHOS 68 44  BILITOT 0.6 0.9  PROT 5.3* 5.5*  ALBUMIN 3.1* 2.8*   No results for input(s): LIPASE, AMYLASE in the last 168 hours. Recent Labs  Lab 07/05/20 0312  AMMONIA 139*   CBC: Recent Labs  Lab 07/05/2020 2206 06/26/2020 2231 07/05/20 0255 07/05/20 0314 07/05/20 1127 2020/07/18 0401 Jul 18, 2020 0443  WBC 19.3*  --   --  19.6*  --   --  3.8*  NEUTROABS 10.2*  --   --   --   --   --   --   HGB 12.3   < > 14.6 15.2* 13.6 13.6 14.4  HCT 38.9   < > 43.0 44.0 40.0 40.0 43.9  MCV 98.7  --   --  90.3  --   --  93.4  PLT 210  --   --  237  --   --  181   < > = values in this interval not displayed.   Cardiac Enzymes: No results for input(s): CKTOTAL, CKMB, CKMBINDEX, TROPONINI in the last 168 hours. Sepsis Labs: Recent Labs  Lab 06/28/2020 2206 07/05/20 0307 07/05/20 0312 07/05/20 0314 07/05/20 0321 07/05/20 1112 07-18-2020 0413 07-18-2020 0434 07/18/2020 0443  PROCALCITON  --   --  12.99  --   --   --  53.73  --   --   WBC 19.3*  --   --  19.6*  --   --   --   --  3.8*  LATICACIDVEN  --  6.4*  --   --  5.1* 5.9*  --  5.2*  --     Procedures/Operations  A-line, central line   Bonna Gains Remigio Mcmillon 2020/07/18, 12:58 PM

## 2020-07-07 NOTE — Significant Event (Signed)
PCCM Brief Progress Note:   Met with Husband Lennette Bihari and son at bedside. They stated that things were bad. I agreed. They feel it is time to let her go. She would not want to live like this. I explained that we would maintain level of support while family comes to say good bye. If she were to pass we would not perform resuscitation. They expressed understanding and agreed. DNR ordere placed. Plan for palliative extubation, comfort care once family has had time to say good bye. I counseled that this course of action was appropriate given the severity of her illness and lack of reversibility. They graciously thanked the medical staff for our efforts.

## 2020-07-07 NOTE — Progress Notes (Addendum)
Pt is no code blue; no apical heart tones noted; no spontaneous respirations noted; pupils fixed and dilated; pt pronounced at 1205 by Burnard Bunting RN and Julius Bowels,  RN; pt family at bedside; [facility] notified.

## 2020-07-07 NOTE — Progress Notes (Signed)
Brief PCCM Progress Note:  Worsening shock last couple of hours. Concern foe ischemic bowel and/or SBO on CXR. Gas in portal system. Acidemia worse. Renal function worse. Surgery evaluated overnight, poor surgical candidate. CT AP ordered and to be obtained soon. Condition likely not survivable. Updated husband. He is en route.  Plan --Escalate to Zosyn --MAP > 65, NE added --Increase RR to help with metabolic acidemia --Continue Bicarb drip --CT AP if remains stable

## 2020-07-07 NOTE — Progress Notes (Signed)
eLink Physician-Brief Progress Note Patient Name: Oluwatobi Ruppe DOB: Aug 28, 1955 MRN: [id number]   Date of Service  07-29-20  HPI/Events of Note  Patient with large volume emesis, she's also spiked a temperature.  eICU Interventions  Hold enteral nutrition, Sat portable CXR and KUB, cooling blanket.        Thomasene Lot Ambika Zettlemoyer 07/29/20, 3:03 AM

## 2020-07-07 NOTE — Consult Note (Signed)
Consulting Physician: Nickola Major Aranda Bihm  Referring Provider: Dr. Lucile Shutters from [facility]  Chief Complaint: Cardiac Arrest  Reason for Consult: Possible small bowel obstruction   Subjective   HPI: Summer Sexton is an 65 y.o. female who is here after a cardiac arrest.  She had 46mn -1 hour of CPR.  She is in the ICU with fentanyl sedation and an epinephrine drip.  She had bowel movements throughout the day yesterday.  Today she became bloated and started vomiting.  An abdominal x-ray was obtained which demonstrated dilated loops of small bowel concerning for possible bowel obstruction.  Unfortunately her Cr is rising, K is rising, she is spiking fevers and lactic acid is increasing.  Past Medical History:  Diagnosis Date   Arthritis    Fibromyalgia    Hypertension    Kidney disease    Liver disease    Murmur    Right bundle branch block     Past Surgical History:  Procedure Laterality Date   CESAREAN SECTION     KNEE SURGERY     PARTIAL HYSTERECTOMY      Family History  Problem Relation Age of Onset   Hypertension Mother    Hypertension Father     Social:  reports that she has never smoked. She has never used smokeless tobacco. She reports current alcohol use. She reports that she does not use drugs.  Allergies: No Known Allergies  Medications: Current Outpatient Medications  Medication Instructions   acetaminophen (TYLENOL) 500-1,000 mg, Oral, Every 6 hours PRN   benzonatate (TESSALON) 100-200 mg, Oral, 3 times daily PRN   cetirizine (ZYRTEC) 10 mg, Oral, Daily PRN   DULoxetine (CYMBALTA) 60 mg, Oral, Daily   ibuprofen (ADVIL) 200 mg, Oral, Every 6 hours PRN   levothyroxine (SYNTHROID) 100 mcg, Oral, Daily   olmesartan-hydrochlorothiazide (BENICAR HCT) 40-25 MG tablet 1 tablet, Oral, Daily   rosuvastatin (CRESTOR) 5 mg, Oral, Daily at bedtime   traMADol (ULTRAM) 50 mg, Oral, Every 6 hours PRN    ROS - all of the below systems have been reviewed with the patient and  positives are indicated with bold text General: chills, fever or night sweats Eyes: blurry vision or double vision ENT: epistaxis or sore throat Allergy/Immunology: itchy/watery eyes or nasal congestion Hematologic/Lymphatic: bleeding problems, blood clots or swollen lymph nodes Endocrine: temperature intolerance or unexpected weight changes Breast: new or changing breast lumps or nipple discharge Resp: cough, shortness of breath, or wheezing CV: chest pain or dyspnea on exertion GI: as per HPI GU: dysuria, trouble voiding, or hematuria MSK: joint pain or joint stiffness Neuro: TIA or stroke symptoms Derm: pruritus and skin lesion changes Psych: anxiety and depression  Objective   PE Blood pressure 126/70, pulse 95, temperature (!) 103.64 F (39.8 C), resp. rate 20, height 5' 8"  (1.727 m), weight 83 kg, SpO2 96 %. Constitutional: Intubated, sedated Eyes:blinking, not tracking Neck: Trachea midline; no thyromegaly Lungs: ventilated, bilateral breath sounds CV: epinephrine drip for support, HR in 90s range, regular GI: Distended, firm, no palpable hernias, small bore NG in place with tube feeds clamped MQQI:WLNLGXQJJHERDmovements of the feet and hands Psychiatric: sedated Lymphatic: No palpable cervical or axillary lymphadenopathy  Results for orders placed or performed during the hospital encounter of 06/27/2020 (from the past 24 hour(s))  Glucose, capillary     Status: Abnormal   Collection Time: 07/05/20  7:38 AM  Result Value Ref Range   Glucose-Capillary 208 (H) 70 - 99 mg/dL  MRSA Next Gen  by PCR, Nasal     Status: None   Collection Time: 07/05/20  8:12 AM  Result Value Ref Range   MRSA by PCR Next Gen NOT DETECTED NOT DETECTED  Strep pneumoniae urinary antigen     Status: None   Collection Time: 07/05/20  8:21 AM  Result Value Ref Range   Strep Pneumo Urinary Antigen NEGATIVE NEGATIVE  Lactic acid, plasma     Status: Abnormal   Collection Time: 07/05/20 11:12 AM   Result Value Ref Range   Lactic Acid, Venous 5.9 (HH) 0.5 - 1.9 mmol/L  Beta-hydroxybutyric acid     Status: None   Collection Time: 07/05/20 11:12 AM  Result Value Ref Range   Beta-Hydroxybutyric Acid 0.18 0.05 - 0.27 mmol/L  Cortisol     Status: None   Collection Time: 07/05/20 11:12 AM  Result Value Ref Range   Cortisol, Plasma 53.7 ug/dL  Magnesium     Status: Abnormal   Collection Time: 07/05/20 11:12 AM  Result Value Ref Range   Magnesium 3.2 (H) 1.7 - 2.4 mg/dL  Phosphorus     Status: Abnormal   Collection Time: 07/05/20 11:12 AM  Result Value Ref Range   Phosphorus 5.9 (H) 2.5 - 4.6 mg/dL  Basic metabolic panel     Status: Abnormal   Collection Time: 07/05/20 11:12 AM  Result Value Ref Range   Sodium 142 135 - 145 mmol/L   Potassium 2.9 (L) 3.5 - 5.1 mmol/L   Chloride 111 98 - 111 mmol/L   CO2 15 (L) 22 - 32 mmol/L   Glucose, Bld 238 (H) 70 - 99 mg/dL   BUN 42 (H) 8 - 23 mg/dL   Creatinine, Ser 3.13 (H) 0.44 - 1.00 mg/dL   Calcium 9.8 8.9 - 10.3 mg/dL   GFR, Estimated 16 (L) >60 mL/min   Anion gap 16 (H) 5 - 15  Troponin I (High Sensitivity)     Status: Abnormal   Collection Time: 07/05/20 11:12 AM  Result Value Ref Range   Troponin I (High Sensitivity) 20,678 (HH) <18 ng/L  T4, free     Status: None   Collection Time: 07/05/20 11:12 AM  Result Value Ref Range   Free T4 1.05 0.61 - 1.12 ng/dL  I-STAT 7, (LYTES, BLD GAS, ICA, H+H)     Status: Abnormal   Collection Time: 07/05/20 11:27 AM  Result Value Ref Range   pH, Arterial 7.215 (L) 7.350 - 7.450   pCO2 arterial 40.3 32.0 - 48.0 mmHg   pO2, Arterial 95 83.0 - 108.0 mmHg   Bicarbonate 16.3 (L) 20.0 - 28.0 mmol/L   TCO2 17 (L) 22 - 32 mmol/L   O2 Saturation 96.0 %   Acid-base deficit 11.0 (H) 0.0 - 2.0 mmol/L   Sodium 143 135 - 145 mmol/L   Potassium 2.9 (L) 3.5 - 5.1 mmol/L   Calcium, Ion 1.37 1.15 - 1.40 mmol/L   HCT 40.0 36.0 - 46.0 %   Hemoglobin 13.6 12.0 - 15.0 g/dL   Patient temperature 98.8 F     Collection site [employer] features editor by Operator    Sample type ARTERIAL   Glucose, capillary     Status: Abnormal   Collection Time: 07/05/20 11:48 AM  Result Value Ref Range   Glucose-Capillary 205 (H) 70 - 99 mg/dL  .Cooxemetry Panel (carboxy, met, total hgb, O2 sat)     Status: None   Collection Time: 07/05/20  1:48 PM  Result Value Ref Range  Total hemoglobin 12.8 12.0 - 16.0 g/dL   O2 Saturation 77.7 %   Carboxyhemoglobin 0.6 0.5 - 1.5 %   Methemoglobin 0.9 0.0 - 1.5 %  Glucose, capillary     Status: Abnormal   Collection Time: 07/05/20  3:26 PM  Result Value Ref Range   Glucose-Capillary 183 (H) 70 - 99 mg/dL  Glucose, capillary     Status: Abnormal   Collection Time: 07/05/20  7:39 PM  Result Value Ref Range   Glucose-Capillary 132 (H) 70 - 99 mg/dL  Basic metabolic panel     Status: Abnormal   Collection Time: 07/05/20 10:05 PM  Result Value Ref Range   Sodium 139 135 - 145 mmol/L   Potassium 4.4 3.5 - 5.1 mmol/L   Chloride 113 (H) 98 - 111 mmol/L   CO2 16 (L) 22 - 32 mmol/L   Glucose, Bld 159 (H) 70 - 99 mg/dL   BUN 52 (H) 8 - 23 mg/dL   Creatinine, Ser 3.82 (H) 0.44 - 1.00 mg/dL   Calcium 8.8 (L) 8.9 - 10.3 mg/dL   GFR, Estimated 13 (L) >60 mL/min   Anion gap 10 5 - 15  Magnesium     Status: Abnormal   Collection Time: 07/05/20 10:05 PM  Result Value Ref Range   Magnesium 2.7 (H) 1.7 - 2.4 mg/dL  Phosphorus     Status: None   Collection Time: 07/05/20 10:39 PM  Result Value Ref Range   Phosphorus 4.4 2.5 - 4.6 mg/dL  Glucose, capillary     Status: Abnormal   Collection Time: 07/05/20 11:26 PM  Result Value Ref Range   Glucose-Capillary 130 (H) 70 - 99 mg/dL  Heparin level (unfractionated)     Status: None   Collection Time: Jul 24, 2020  2:13 AM  Result Value Ref Range   Heparin Unfractionated 0.36 0.30 - 0.70 IU/mL  Glucose, capillary     Status: Abnormal   Collection Time: 07-24-20  3:30 AM  Result Value Ref Range   Glucose-Capillary 111 (H) 70 - 99  mg/dL  I-STAT 7, (LYTES, BLD GAS, ICA, H+H)     Status: Abnormal   Collection Time: 24-Jul-2020  4:01 AM  Result Value Ref Range   pH, Arterial 7.159 (LL) 7.350 - 7.450   pCO2 arterial 38.9 32.0 - 48.0 mmHg   pO2, Arterial 80 (L) 83.0 - 108.0 mmHg   Bicarbonate 13.3 (L) 20.0 - 28.0 mmol/L   TCO2 14 (L) 22 - 32 mmol/L   O2 Saturation 88.0 %   Acid-base deficit 14.0 (H) 0.0 - 2.0 mmol/L   Sodium 139 135 - 145 mmol/L   Potassium 5.8 (H) 3.5 - 5.1 mmol/L   Calcium, Ion 1.22 1.15 - 1.40 mmol/L   HCT 40.0 36.0 - 46.0 %   Hemoglobin 13.6 12.0 - 15.0 g/dL   Patient temperature 39.9 C    Collection site art line    Drawn by Operator    Sample type ARTERIAL    Comment NOTIFIED PHYSICIAN   Lactic acid, plasma     Status: Abnormal   Collection Time: 07/24/20  4:34 AM  Result Value Ref Range   Lactic Acid, Venous 5.2 (HH) 0.5 - 1.9 mmol/L  CBC     Status: Abnormal   Collection Time: 2020/07/24  4:43 AM  Result Value Ref Range   WBC 3.8 (L) 4.0 - 10.5 K/uL   RBC 4.70 3.87 - 5.11 MIL/uL   Hemoglobin 14.4 12.0 - 15.0 g/dL   HCT 43.9 36.0 -  46.0 %   MCV 93.4 80.0 - 100.0 fL   MCH 30.6 26.0 - 34.0 pg   MCHC 32.8 30.0 - 36.0 g/dL   RDW 13.8 11.5 - 15.5 %   Platelets 181 150 - 400 K/uL   nRBC 0.8 (H) 0.0 - 0.2 %     Imaging Orders  DG Chest Port 1 View  CT Head Wo Contrast  DG CHEST PORT 1 VIEW  US Abdomen Limited RUQ (LIVER/GB)  DG Chest Port 1 View  DG Chest Port 1 View  DG Abd Portable 1V  DG Abd 1 View    Assessment and Plan   Summer Sexton is an 65 y.o. female s/p cardiac arrest with a possible small bowel obstruction of the abdomen.  It is hard to say if this is a bowel obstruction or ileus based on the x-ray and the prolonged CPR.  I recommend placement of a larger bore OG tube to low intermittent wall suction for decompression of the GI track.  We will monitor her response to this therapy.  With her recent arrest, severe acidosis, and worsening kidney function with rising potassium,  if she does develop an indication for abdominal surgery, she may not be able to survive an operation.      ICD-10-CM   1. Cardiac arrest (HCC)  I46.9 DG CHEST PORT 1 VIEW    DG CHEST PORT 1 VIEW    2. Transaminitis  R74.01 US Abdomen Limited RUQ (LIVER/GB)    US Abdomen Limited RUQ (LIVER/GB)    3. Aspiration pneumonia ([place])  J69.0 DG Chest [facility] 1 View    DG Chest Port 1 View    4. Encounter for central line placement  Z45.2 DG Chest [facility] 1 View    DG Chest [place] 1 View    5. Encounter for feeding tube placement  Z46.59 DG Abd Portable 1V    DG Abd Portable 1V    6. Acute respiratory failure (HCC)  J96.00 CANCELED: DG Chest Port 1 View    CANCELED: DG Chest Port 1 View    7. Aspiration into lower respiratory tract  T17.800A CANCELED: DG Chest Port 1 View    CANCELED: DG Chest Port 1 View    8. Ileus ([place])  K56.7 DG Abd [place], MD  [facility] Use [email or web address] to contact on call provider

## 2020-07-07 DEATH — deceased

## 2020-07-09 LAB — URINE DRUGS OF ABUSE SCREEN W ALC, ROUTINE (REF LAB)
Amphetamines, Urine: NEGATIVE ng/mL
Barbiturate, Ur: NEGATIVE ng/mL
Cannabinoid Quant, Ur: NEGATIVE ng/mL
Cocaine (Metab.): NEGATIVE ng/mL
Ethanol U, Quan: NEGATIVE %
Methadone Screen, Urine: NEGATIVE ng/mL
Opiate Quant, Ur: NEGATIVE ng/mL
Phencyclidine, Ur: NEGATIVE ng/mL
Propoxyphene, Urine: NEGATIVE ng/mL

## 2020-07-09 LAB — DRUG PROFILE 799031: BENZODIAZEPINES: NEGATIVE

## 2020-07-10 LAB — CULTURE, BLOOD (ROUTINE X 2)
Culture: NO GROWTH
Culture: NO GROWTH
Special Requests: ADEQUATE
Special Requests: ADEQUATE

## 2022-02-18 IMAGING — DX DG CHEST 1V PORT
1 series · 1 of 1 positions shown · non-contrast
Comparison: 07/04/2020.

CLINICAL DATA: Cardiac arrest.

EXAM:
PORTABLE CHEST 1 VIEW

[chest]
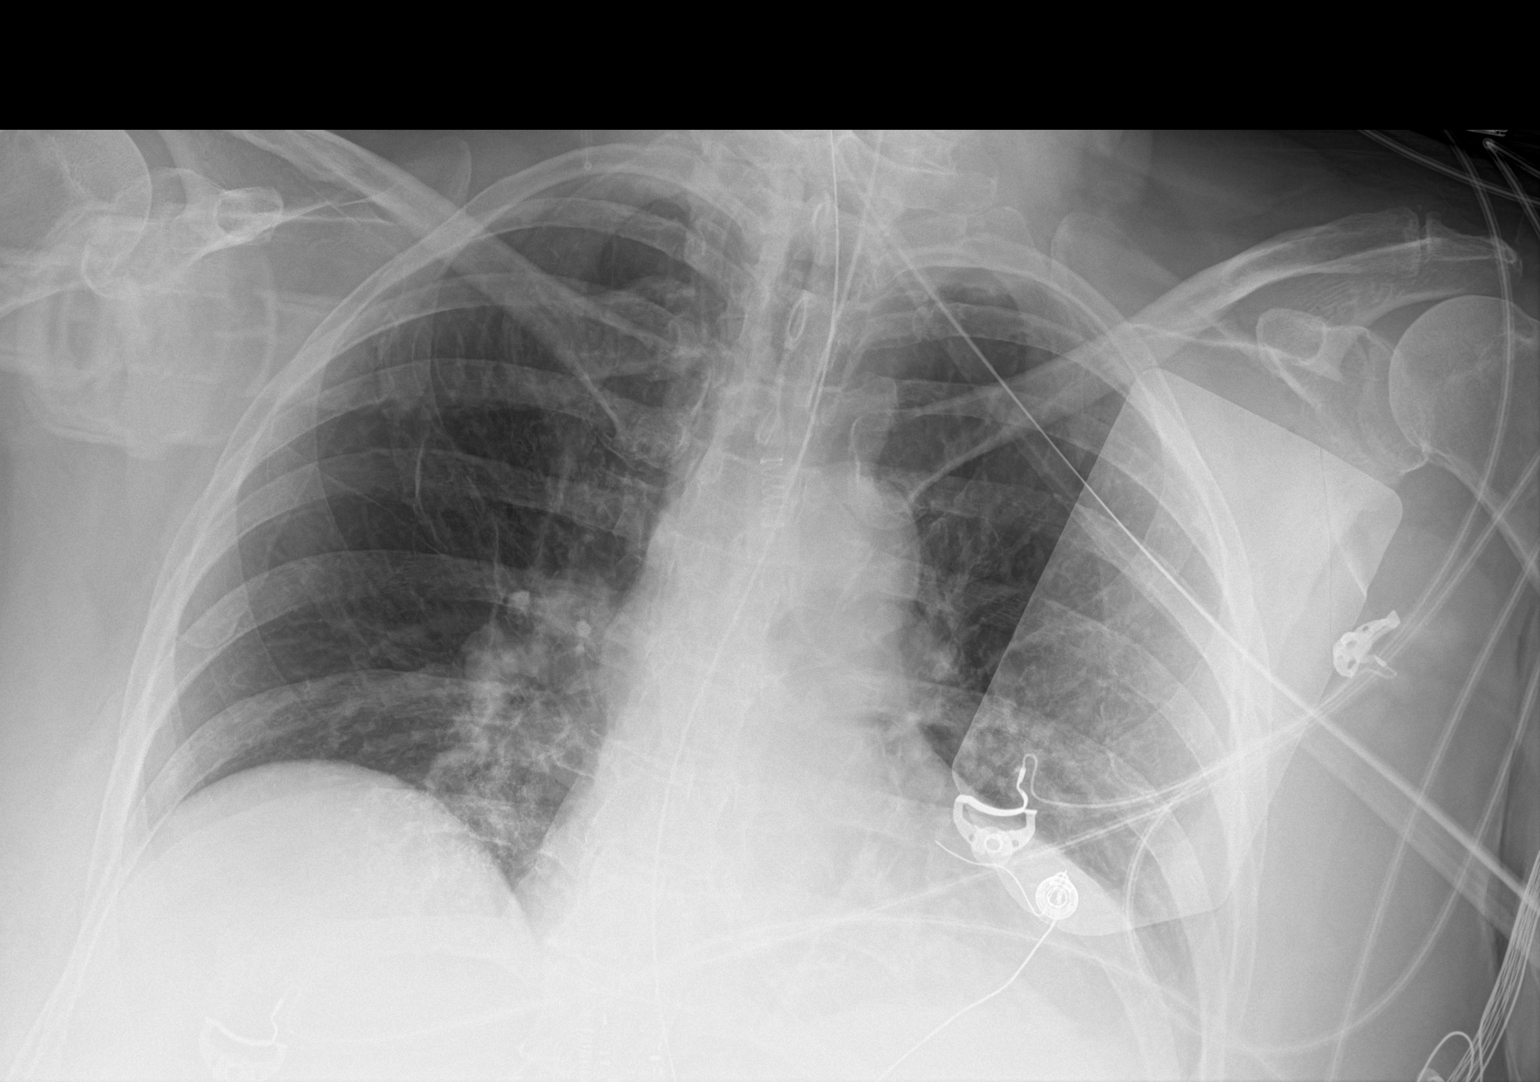

[1 of 1 positions shown; findings below may reference images not displayed]

FINDINGS: Endotracheal tube, NG tube in stable position. Heart size normal.
Low lung volumes with mild bibasilar atelectasis/infiltrates. Small
left pleural effusion cannot be excluded. Left costophrenic angle
incompletely imaged. No pneumothorax.
IMPRESSION: 1.  Lines and tubes in stable position.

2. Low lung volumes with mild bibasilar atelectasis/infiltrates.
Small left pleural effusion cannot be excluded.

## 2022-02-18 IMAGING — US US ABDOMEN LIMITED
1 series · 14 of 25 positions shown · non-contrast
Comparison: CT 06/08/2009.

CLINICAL DATA: Transaminitis.

EXAM:
ULTRASOUND ABDOMEN LIMITED RIGHT UPPER QUADRANT

[Series 1: us abdomen limited ruq (liver/gb) · 14 of 28 slices shown]
[im 1/28]
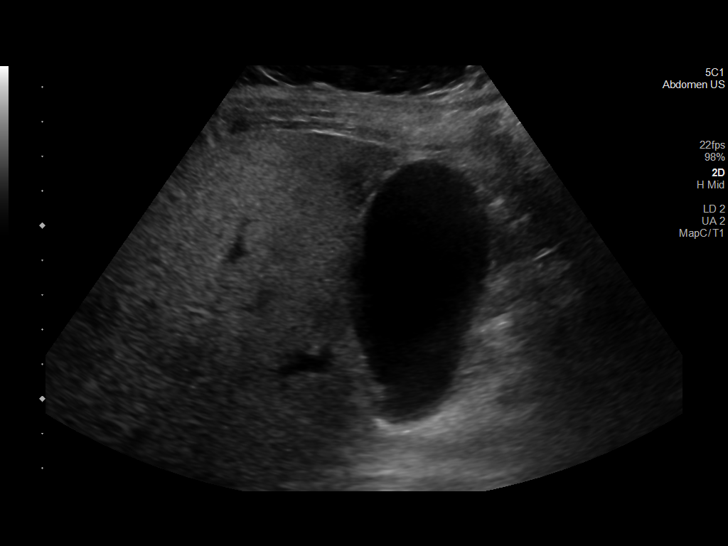
[im 3/28]
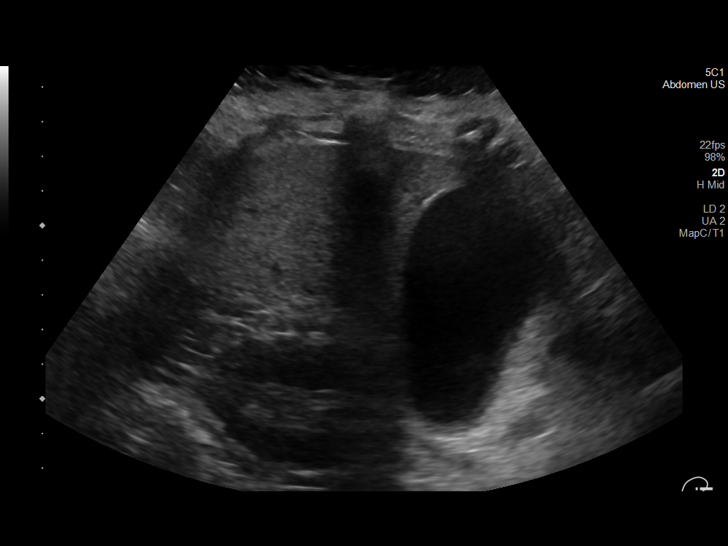
[im 5/28]
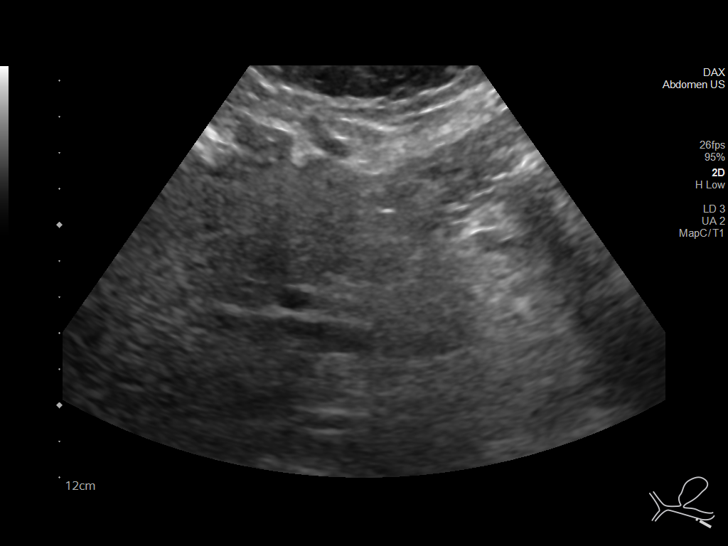
[im 7/28]
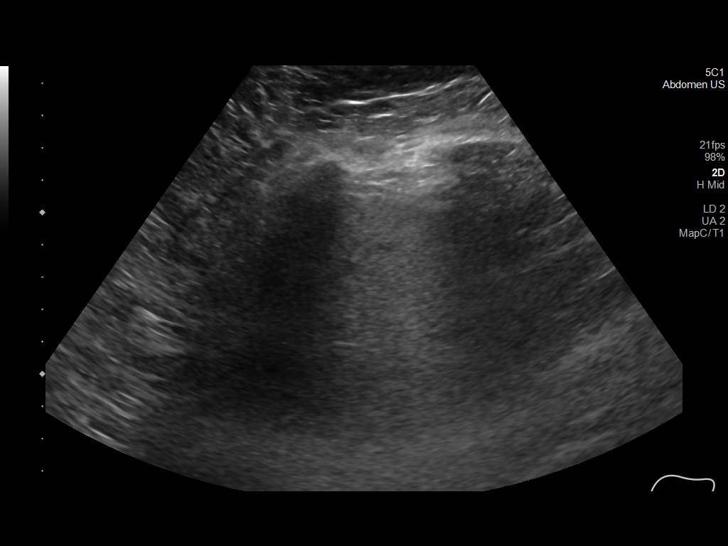
[im 10/28]
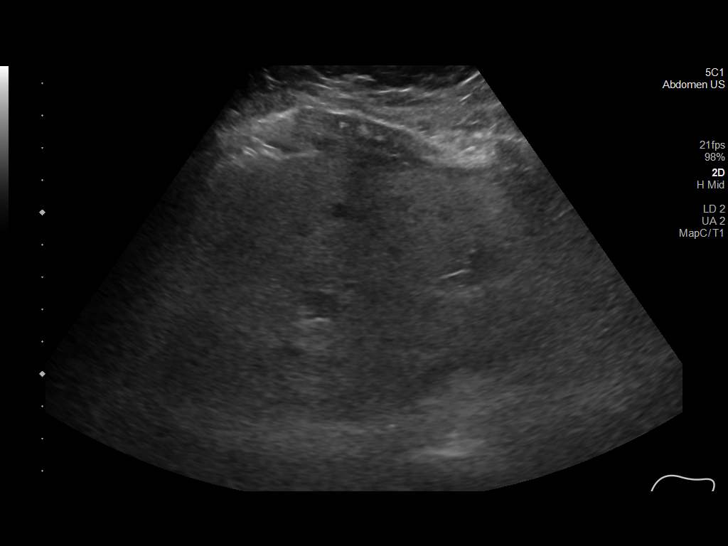
[im 11/28]
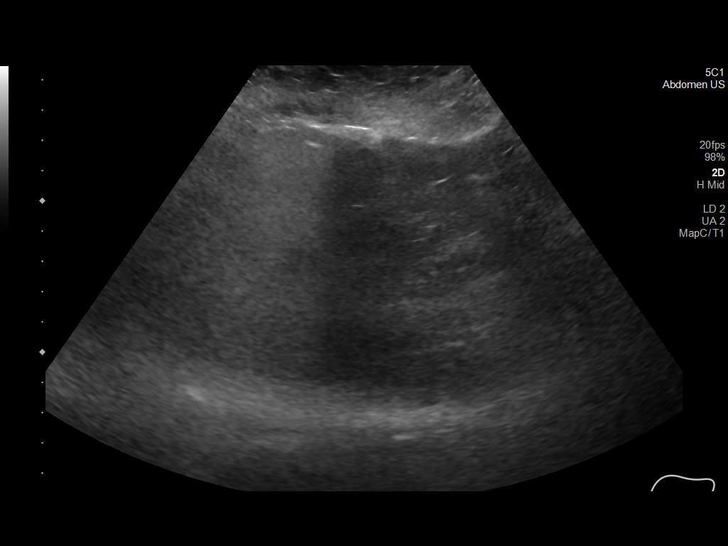
[im 13/28]
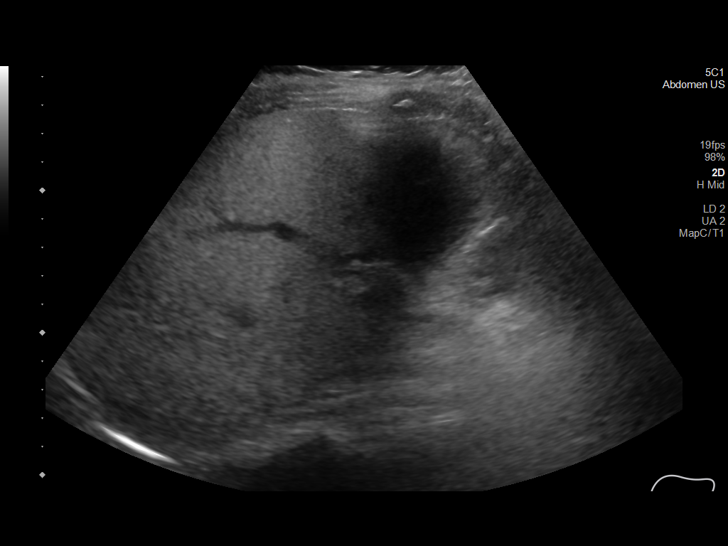
[im 15/28]
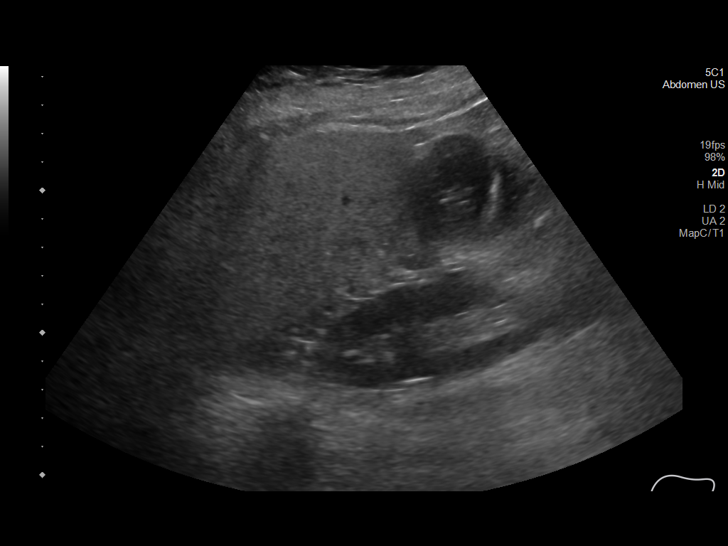
[im 17/28]
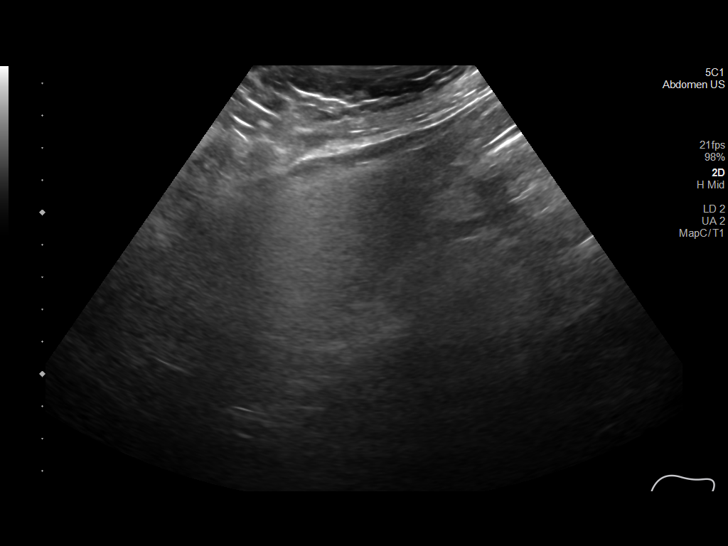
[im 19/28]
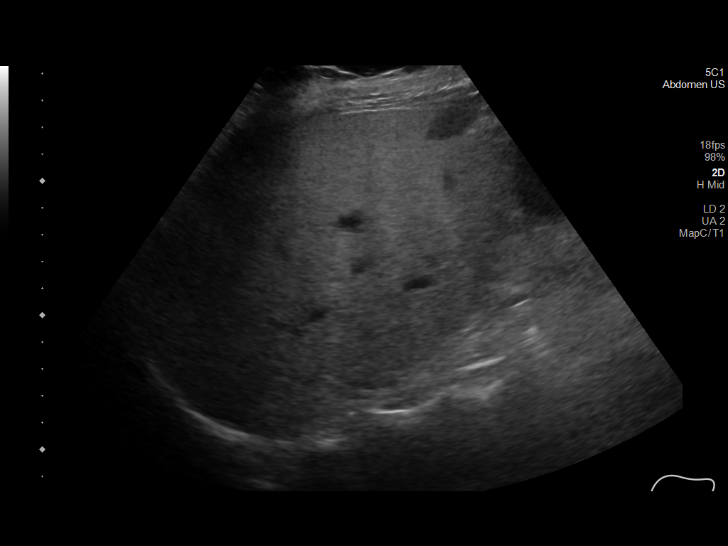
[im 21/28]
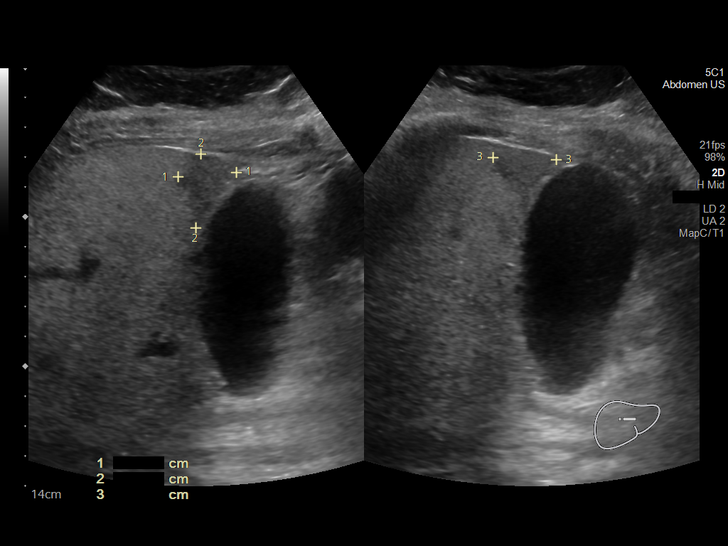
[im 23/28]
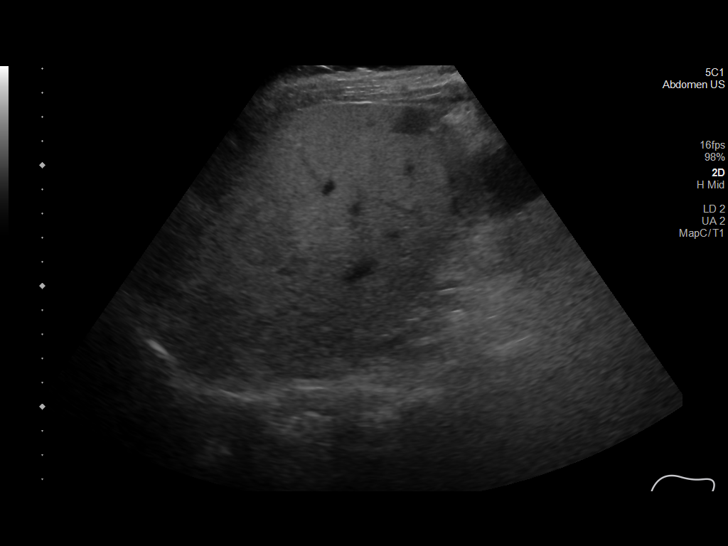
[im 25/28]
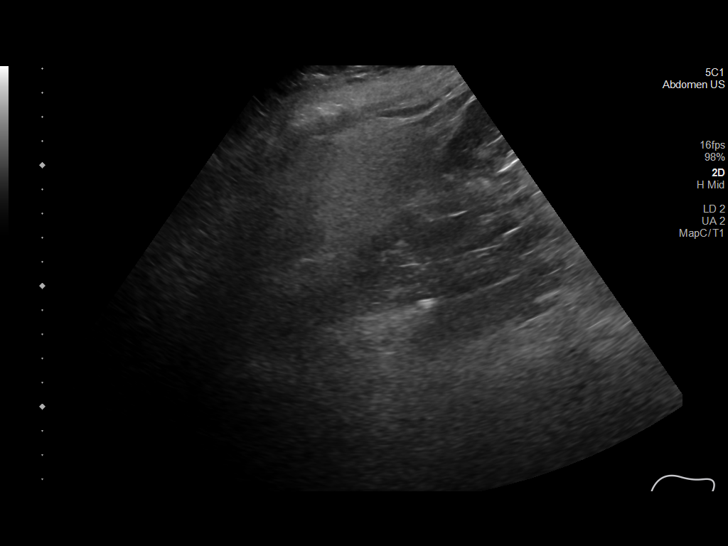
[im 28/28]
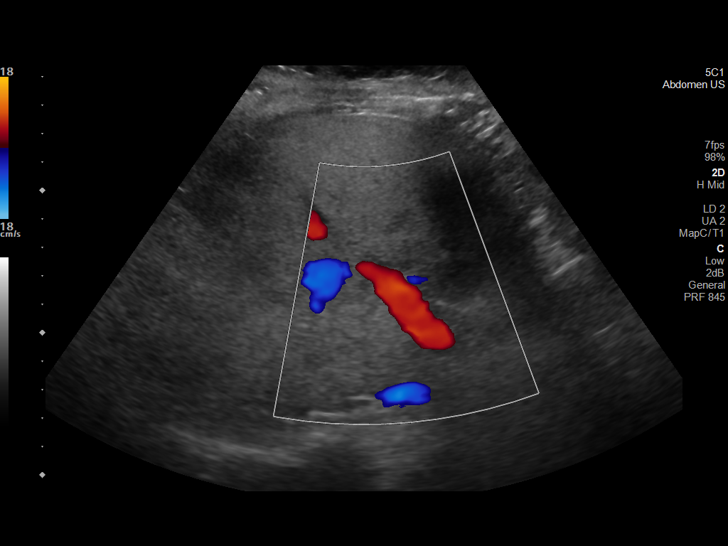

[14 of 25 positions shown; findings below may reference images not displayed]

FINDINGS: Gallbladder:

No gallstones or wall thickening visualized. No sonographic Murphy
sign noted by sonographer.

Common bile duct:

Diameter: 3.8 mm

Liver:

Left hepatic lobe difficult visualized. Increased hepatic
echogenicity consistent fatty infiltration and or hepatocellular
disease. Small area of decreased attenuation noted adjacent to the
gallbladder fossa, most likely focal fatty sparing. Portal vein is
patent on color Doppler imaging with normal direction of blood flow
towards the liver.

Other: None.
IMPRESSION: 1.  No gallstones or biliary distention.

2. Left hepatic lobe poorly visualized. Increased hepatic
echogenicity consistent with fatty infiltration or hepatocellular
disease. Small area of decreased attenuation noted adjacent to the
gallbladder fossa, most likely focal fatty sparing. Follow-up
ultrasound in 3 months can be obtained to ensure stability of this
area of probable focal fatty sparing.

## 2022-02-18 IMAGING — DX DG ABD PORTABLE 1V
1 series · 1 of 1 positions shown · non-contrast
Comparison: None.

CLINICAL DATA: Feeding tube placement.

EXAM:
PORTABLE ABDOMEN - 1 VIEW

[abdomen]
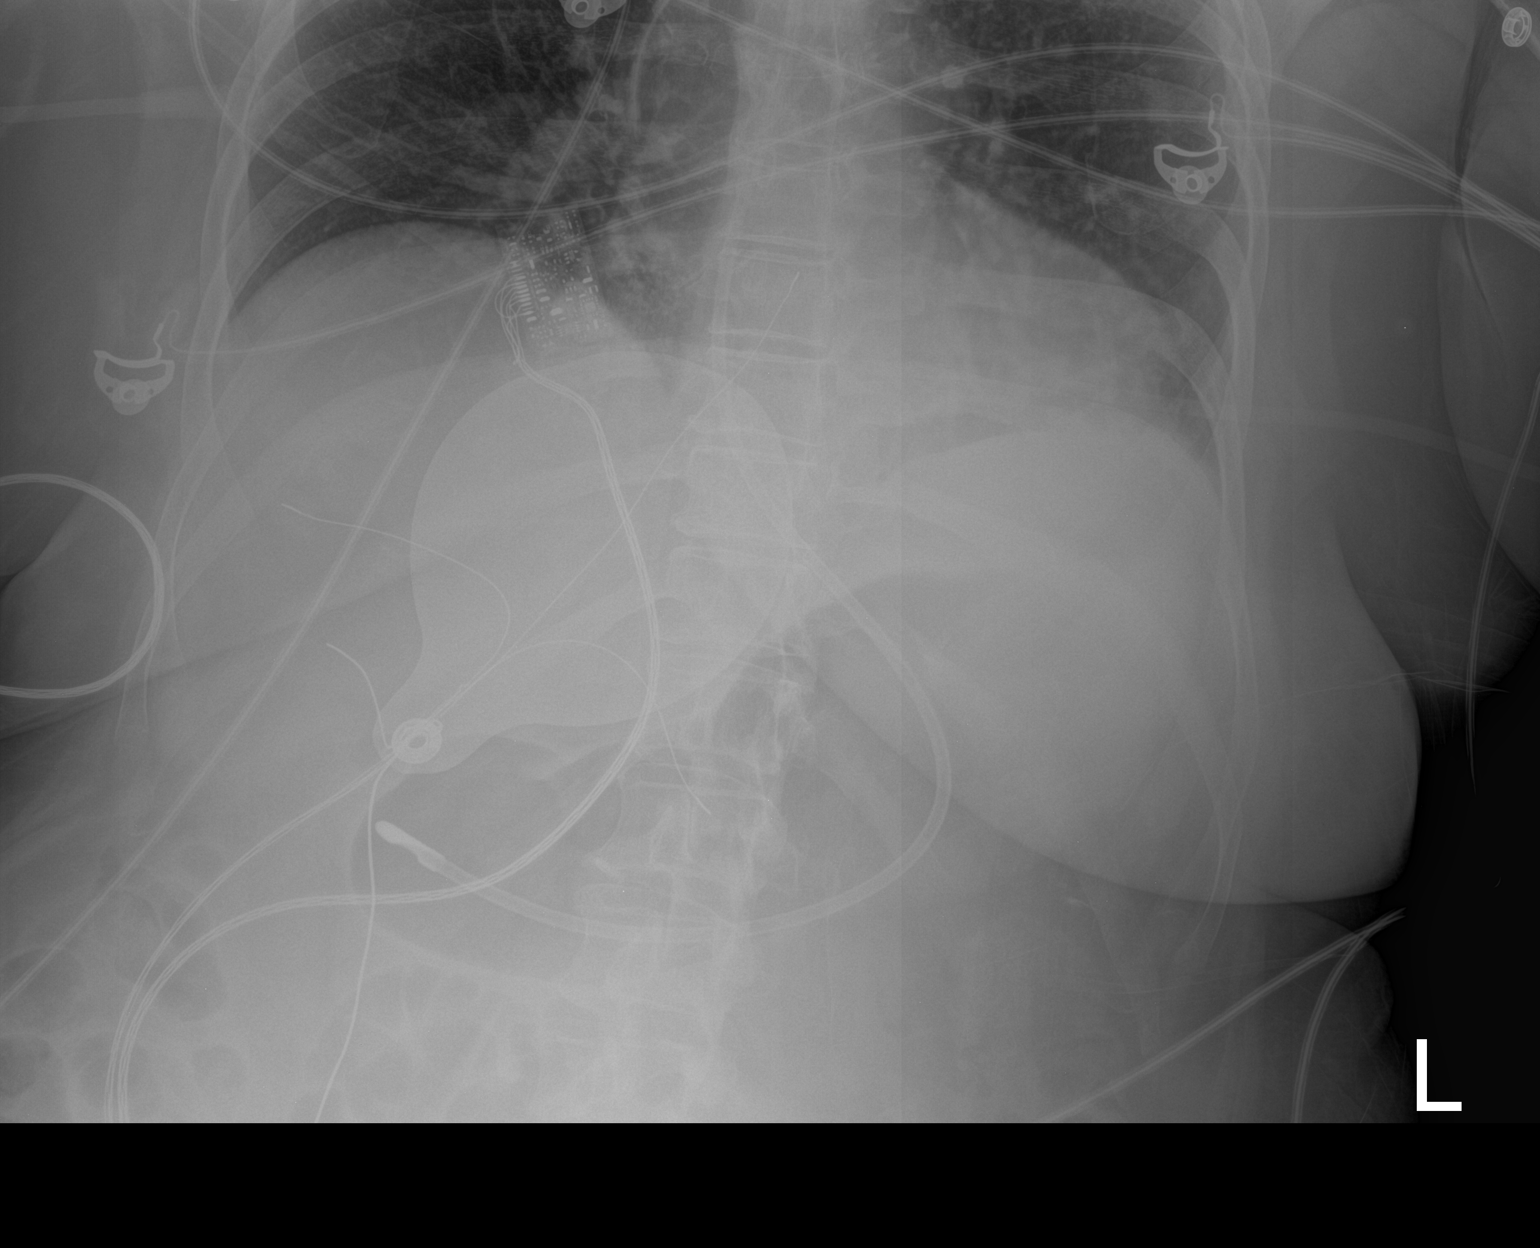

[1 of 1 positions shown; findings below may reference images not displayed]

FINDINGS: A nasogastric feeding tube is seen with its distal tip overlying the
expected region of the gastric antrum. The bowel gas pattern is
normal. No radio-opaque calculi or other significant radiographic
abnormality are seen.
IMPRESSION: Feeding tube positioning, as described above.

## 2022-02-19 IMAGING — DX DG ABD PORTABLE 1V
1 series · 1 of 1 positions shown · non-contrast
Comparison: Abdomen 07/06/2020.  CT 06/08/2009.

CLINICAL DATA: OG tube advanced.

EXAM:
PORTABLE ABDOMEN - 1 VIEW

[abdomen]
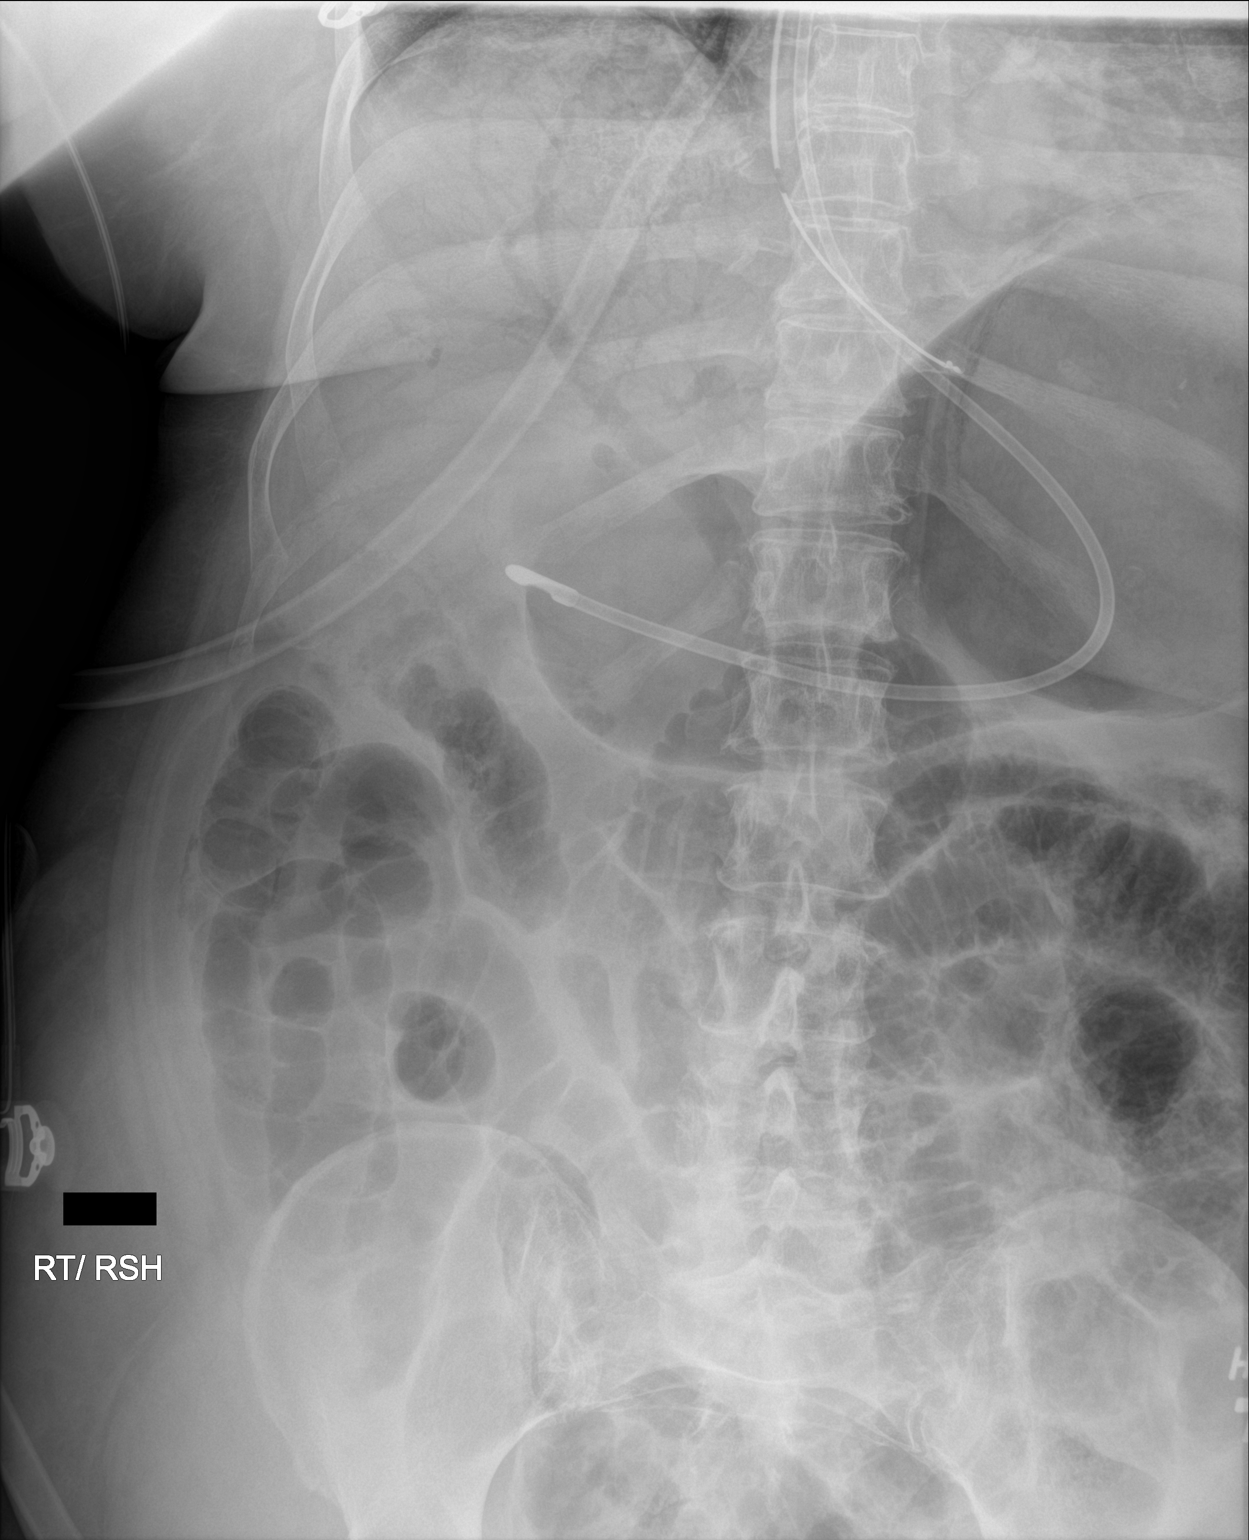

[1 of 1 positions shown; findings below may reference images not displayed]

FINDINGS: Interim placement of OG tube, tip at the upper portion stomach. Side
hole at the gastroesophageal junction. Advancement of 10 cm should
be considered. Feeding tube noted with its tip in the distal
stomach. Stomach remains dilated. Multiple dilated loops of small
bowel again noted. Pneumatosis about the bowel loops over the left
abdomen cannot be excluded. No free air identified. Noted on today's
exam is a prominent amount of air noted over the liver. Pneumobilia
or portal venous air could present in this fashion. No acute bony
abnormality.
IMPRESSION: 1. Interim placement of OG tube, tip over the upper stomach, side
hole at the gastroesophageal junction. Advancement of approximately
10 cm should be considered. Feeding tube noted with tip over the
distal stomach in unchanged position.

2. Persistent gastric distention. Persistent small-bowel distention
suggestive possibility of small-bowel obstruction. No free air
identified. Pneumatosis about bowel loops in the left abdomen cannot
be excluded. The possibly of ischemic bowel should be considered.

3. Also noted on today's exam is a prominent amount of air noted
over the liver. Pneumobilia or portal venous air could present in
this fashion. CT of the abdomen and pelvis suggested for further
evaluation.

Critical Value/emergent results were called by telephone at the time
of interpretation on 07/06/2020 at [DATE] to provider nurse
Fomusoh, who verbally acknowledged these results.

## 2022-02-19 IMAGING — DX DG ABDOMEN 1V
1 series · 3 of 3 positions shown · non-contrast
Comparison: 07/05/2020

CLINICAL DATA: Aspiration, von

EXAM:
ABDOMEN - 1 VIEW

[Series 1: abdomen · 0.14mm/px · 3 of 3 slices shown]
[im 1/3]
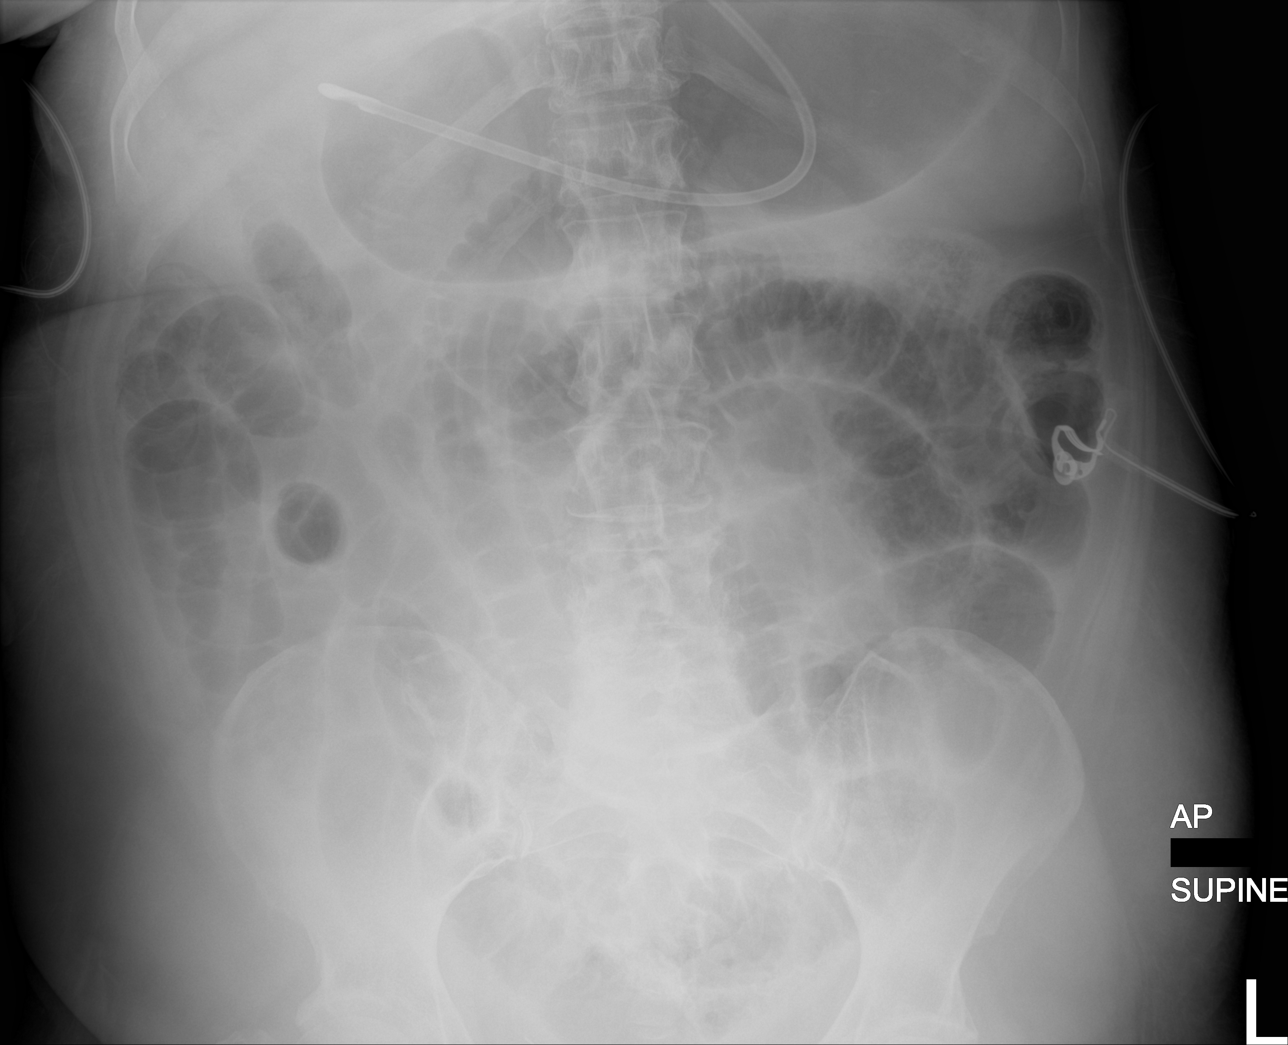
[im 2/3]
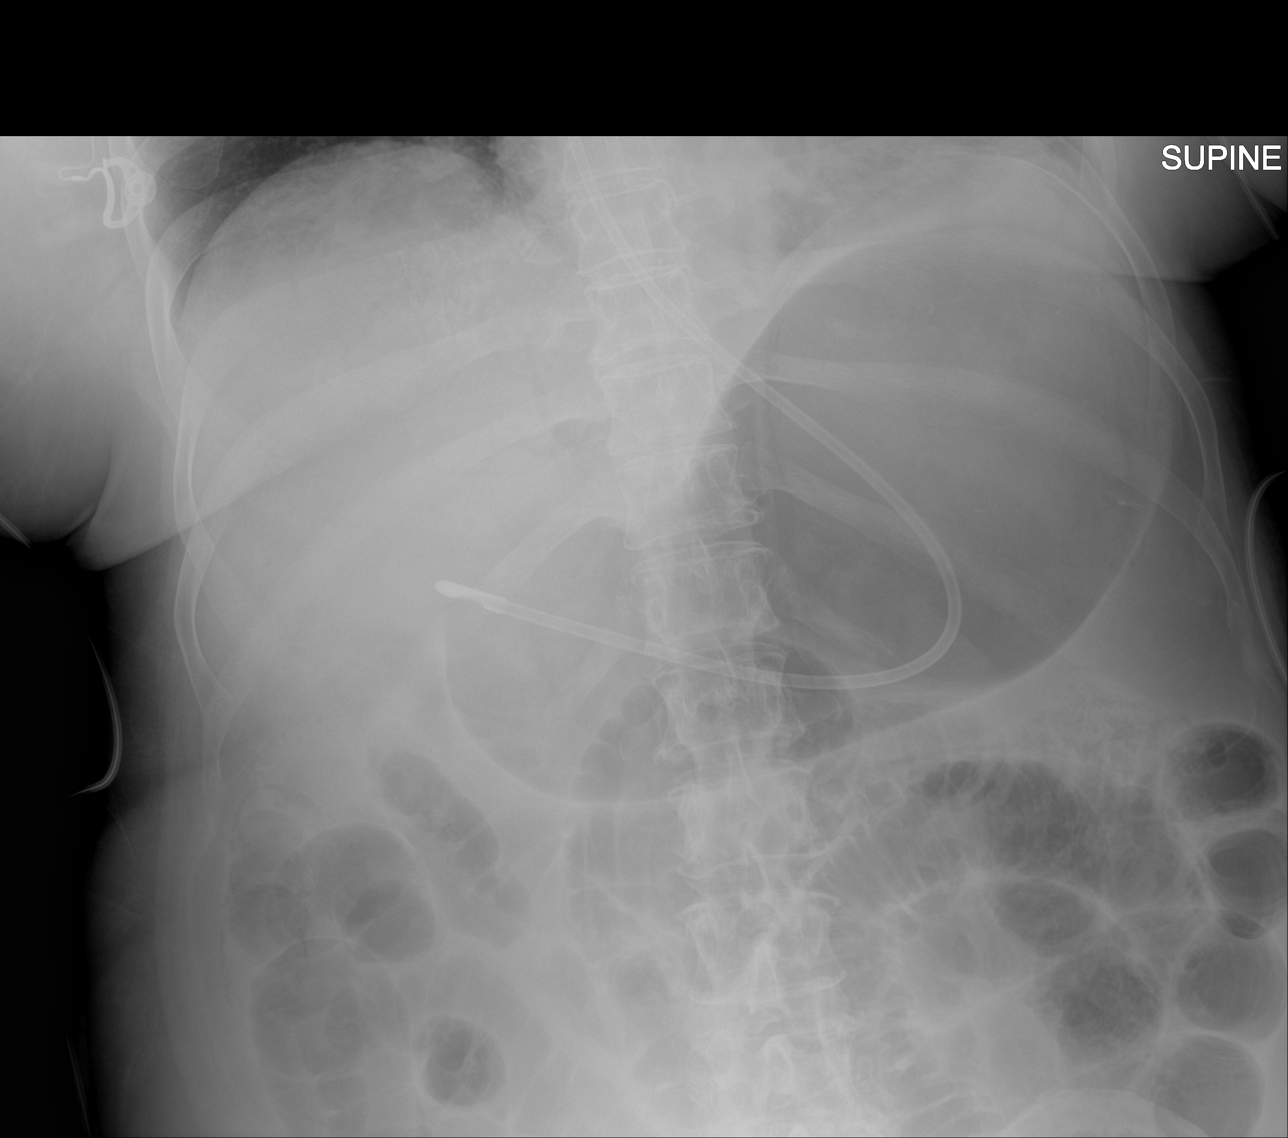
[im 3/3]
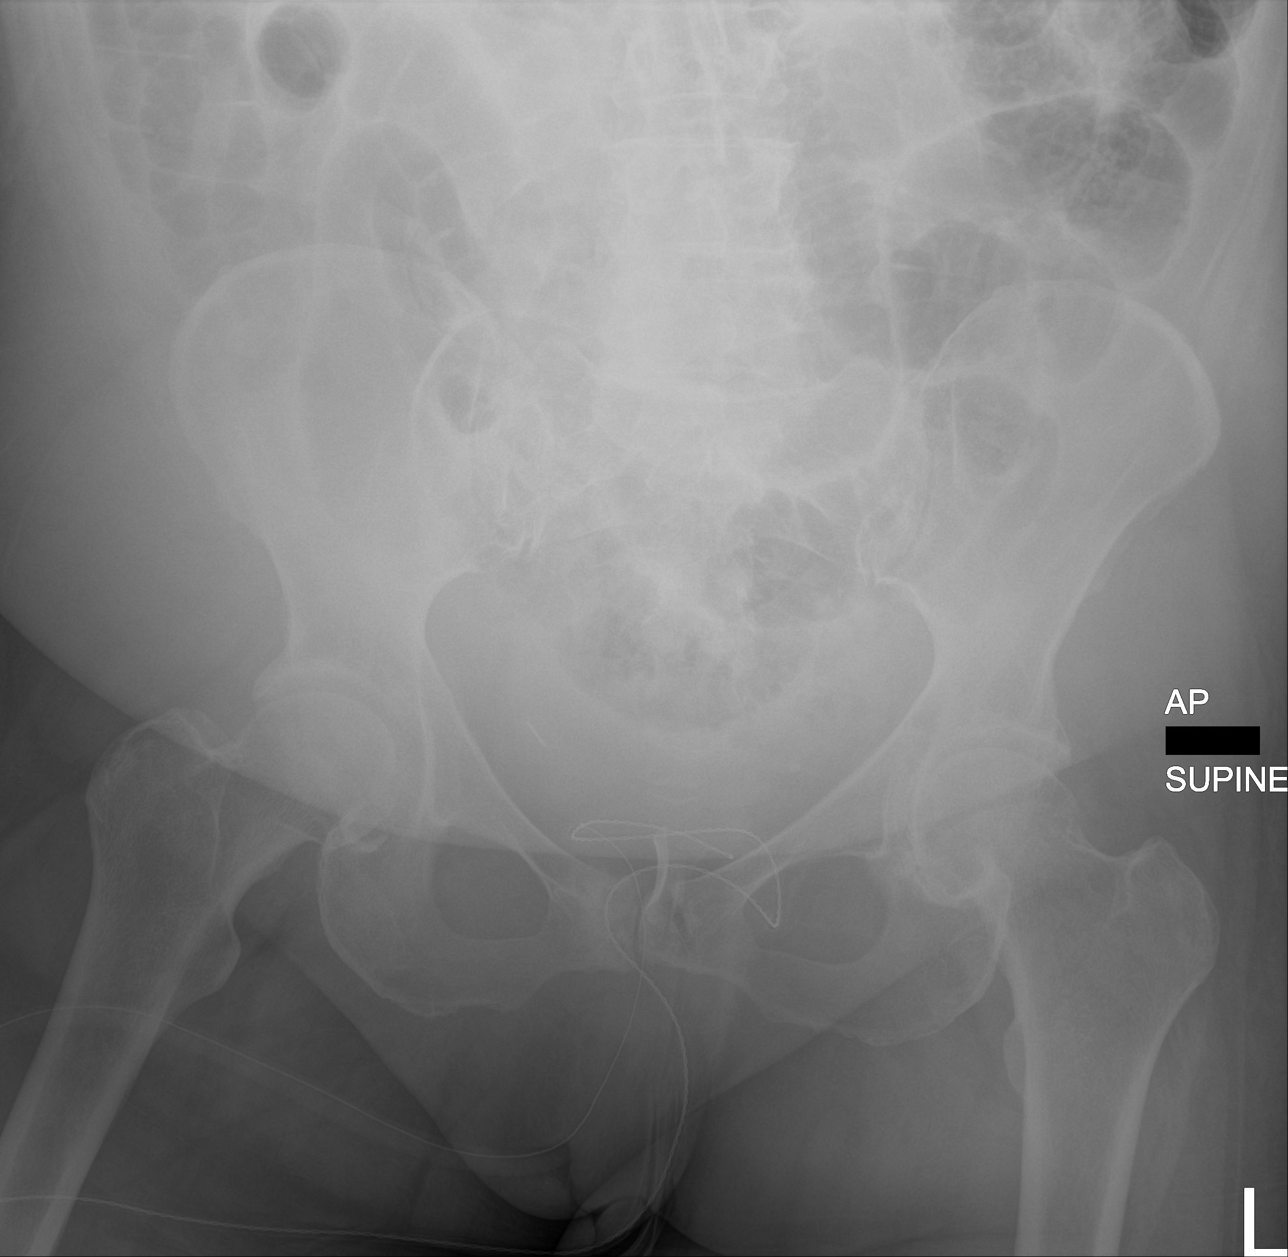

[3 of 3 positions shown; findings below may reference images not displayed]

FINDINGS: Feeding tube is in place with the tip in the distal stomach. There
is gaseous distention of the stomach. Dilated small bowel loops in
the abdomen and pelvis. Cannot exclude small bowel obstruction. No
free air organomegaly.
IMPRESSION: Gaseous distention of the stomach and small bowel concerning for
small bowel obstruction.

Feeding tube tip in the distal stomach.
